# Patient Record
Sex: Female | Born: 1960 | Hispanic: No | Marital: Married | Smoking: Never smoker
Health system: Southern US, Community
[De-identification: ages and names within clinical notes are randomized; demographics above are authoritative.]

## PROBLEM LIST (undated history)

## (undated) DIAGNOSIS — R011 Cardiac murmur, unspecified: Secondary | ICD-10-CM

## (undated) DIAGNOSIS — E119 Type 2 diabetes mellitus without complications: Secondary | ICD-10-CM

## (undated) DIAGNOSIS — E785 Hyperlipidemia, unspecified: Secondary | ICD-10-CM

## (undated) DIAGNOSIS — L719 Rosacea, unspecified: Secondary | ICD-10-CM

## (undated) DIAGNOSIS — K8020 Calculus of gallbladder without cholecystitis without obstruction: Secondary | ICD-10-CM

## (undated) DIAGNOSIS — M797 Fibromyalgia: Secondary | ICD-10-CM

## (undated) HISTORY — DX: Type 2 diabetes mellitus without complications: E11.9

## (undated) HISTORY — PX: NISSEN FUNDOPLICATION: SHX2091

## (undated) HISTORY — PX: FOOT SURGERY: SHX648

## (undated) HISTORY — PX: APPENDECTOMY: SHX54

## (undated) HISTORY — DX: Hyperlipidemia, unspecified: E78.5

## (undated) HISTORY — PX: HERNIA REPAIR: SHX51

## (undated) HISTORY — PX: KNEE ARTHROSCOPY: SUR90

## (undated) HISTORY — DX: Cardiac murmur, unspecified: R01.1

## (undated) HISTORY — PX: OTHER SURGICAL HISTORY: SHX169

---

## 1997-10-19 ENCOUNTER — Emergency Department (HOSPITAL_COMMUNITY): Admission: EM | Admit: 1997-10-19 | Discharge: 1997-10-19 | Payer: Self-pay | Admitting: Emergency Medicine

## 2000-11-24 ENCOUNTER — Emergency Department (HOSPITAL_COMMUNITY): Admission: EM | Admit: 2000-11-24 | Discharge: 2000-11-24 | Payer: Self-pay | Admitting: Emergency Medicine

## 2001-04-20 ENCOUNTER — Encounter: Payer: Self-pay | Admitting: Internal Medicine

## 2001-04-20 ENCOUNTER — Emergency Department (HOSPITAL_COMMUNITY): Admission: EM | Admit: 2001-04-20 | Discharge: 2001-04-20 | Payer: Self-pay | Admitting: Emergency Medicine

## 2002-12-17 ENCOUNTER — Ambulatory Visit (HOSPITAL_COMMUNITY): Admission: RE | Admit: 2002-12-17 | Discharge: 2002-12-17 | Payer: Self-pay | Admitting: Gastroenterology

## 2004-10-23 ENCOUNTER — Other Ambulatory Visit: Admission: RE | Admit: 2004-10-23 | Discharge: 2004-10-23 | Payer: Self-pay | Admitting: Obstetrics and Gynecology

## 2004-10-29 ENCOUNTER — Ambulatory Visit (HOSPITAL_COMMUNITY): Admission: RE | Admit: 2004-10-29 | Discharge: 2004-10-29 | Payer: Self-pay | Admitting: Obstetrics and Gynecology

## 2004-11-15 ENCOUNTER — Ambulatory Visit (HOSPITAL_COMMUNITY): Admission: RE | Admit: 2004-11-15 | Discharge: 2004-11-15 | Payer: Self-pay | Admitting: Obstetrics and Gynecology

## 2004-12-07 ENCOUNTER — Encounter: Admission: RE | Admit: 2004-12-07 | Discharge: 2004-12-07 | Payer: Self-pay | Admitting: Obstetrics and Gynecology

## 2005-04-11 ENCOUNTER — Encounter (INDEPENDENT_AMBULATORY_CARE_PROVIDER_SITE_OTHER): Payer: Self-pay | Admitting: Specialist

## 2005-04-11 ENCOUNTER — Inpatient Hospital Stay (HOSPITAL_COMMUNITY): Admission: EM | Admit: 2005-04-11 | Discharge: 2005-04-12 | Payer: Self-pay | Admitting: Emergency Medicine

## 2005-04-11 ENCOUNTER — Encounter: Admission: RE | Admit: 2005-04-11 | Discharge: 2005-04-11 | Payer: Self-pay | Admitting: Internal Medicine

## 2006-11-24 ENCOUNTER — Other Ambulatory Visit: Admission: RE | Admit: 2006-11-24 | Discharge: 2006-11-24 | Payer: Self-pay | Admitting: Internal Medicine

## 2006-12-10 ENCOUNTER — Inpatient Hospital Stay (HOSPITAL_COMMUNITY): Admission: RE | Admit: 2006-12-10 | Discharge: 2006-12-12 | Payer: Self-pay | Admitting: Gastroenterology

## 2006-12-10 ENCOUNTER — Encounter (INDEPENDENT_AMBULATORY_CARE_PROVIDER_SITE_OTHER): Payer: Self-pay | Admitting: Gastroenterology

## 2007-03-26 ENCOUNTER — Encounter: Admission: RE | Admit: 2007-03-26 | Discharge: 2007-03-26 | Payer: Self-pay | Admitting: Surgery

## 2007-04-09 ENCOUNTER — Ambulatory Visit (HOSPITAL_COMMUNITY): Admission: AD | Admit: 2007-04-09 | Discharge: 2007-04-12 | Payer: Self-pay | Admitting: Surgery

## 2007-06-02 IMAGING — US US TRANSVAGINAL NON-OB
1 series · 14 of 25 positions shown · non-contrast
Comparison: none

CLINICAL DATA: Menorrhagia.  LMP 10/15/04.  Patient on birth control pills.  History of tubal ligation.  
 TRANSABDOMINAL AND TRANSVAGINAL PELVIC ULTRASOUND:
 Transabdominal and transvaginal scanning of the pelvis was performed. 
 The uterus is mildly enlarged measuring 11 cm in length, 6.5 cm in depth, and 8.3 cm in width.  Within the endometrium there is a hypoechoic mass measuring 3.5 x 2.1 x 2.4 cm that is likely a submucosal fibroid.  Endometrial thickness is approximately 16 to 17 mm adjacent to the mass.  No other intramural fibroids are identified.  This seems to be attached to the fundal portion of the myometrium.  
 The right ovary contains a simple cyst measuring 2.8 x 1.7 x 1.6 cm.  The left ovary is poorly seen, but normal.  No free fluid is identified.

[Series 1: us transvaginal non-ob · 0.41mm/px · 14 of 54 slices shown]
[im 1/54]
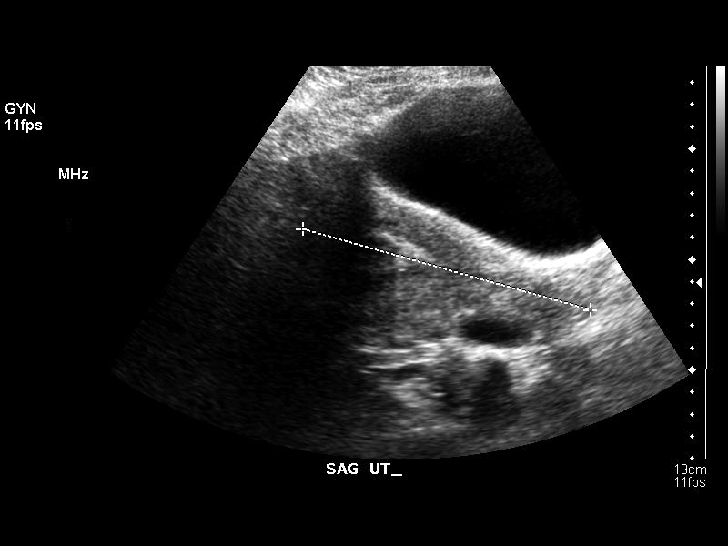
[im 5/54]
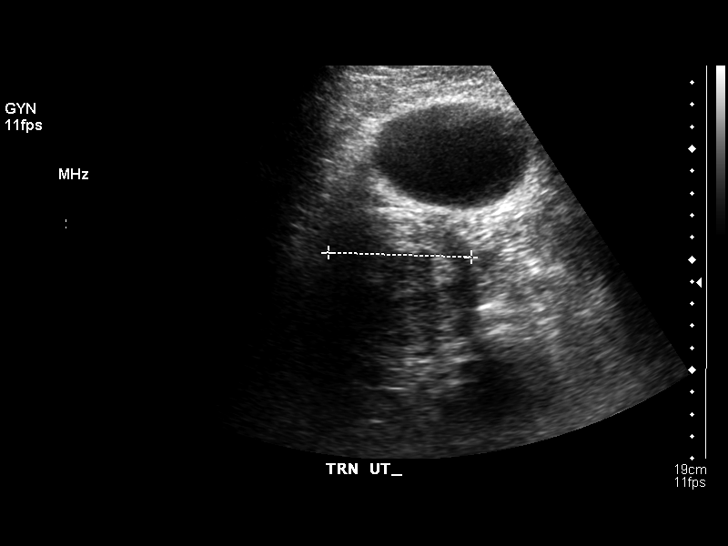
[im 9/54]
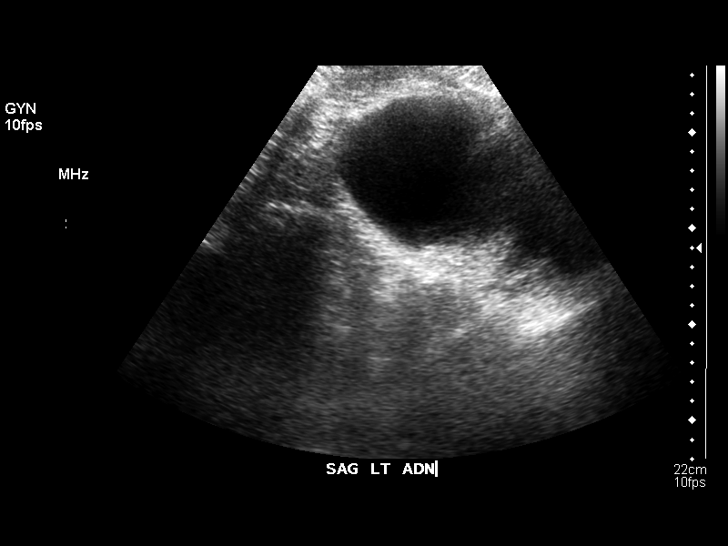
[im 14/54]
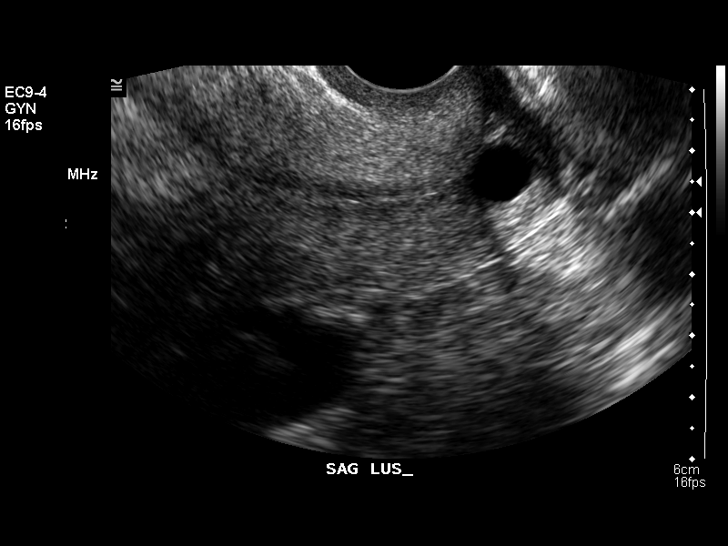
[im 18/54]
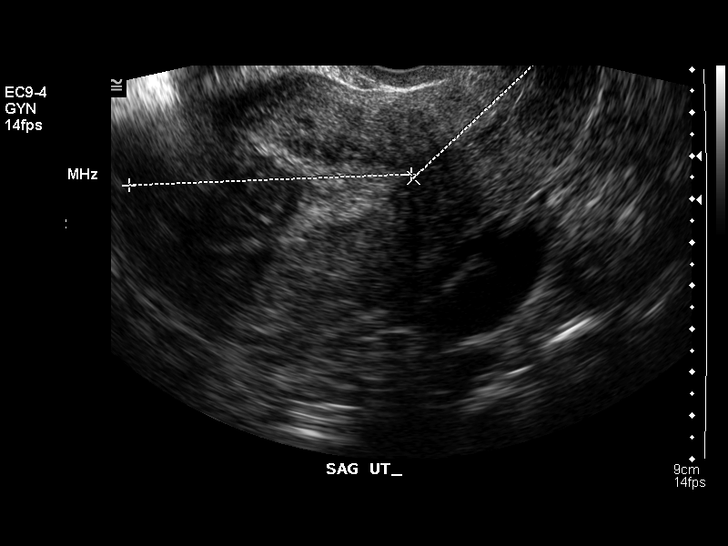
[im 20/54]
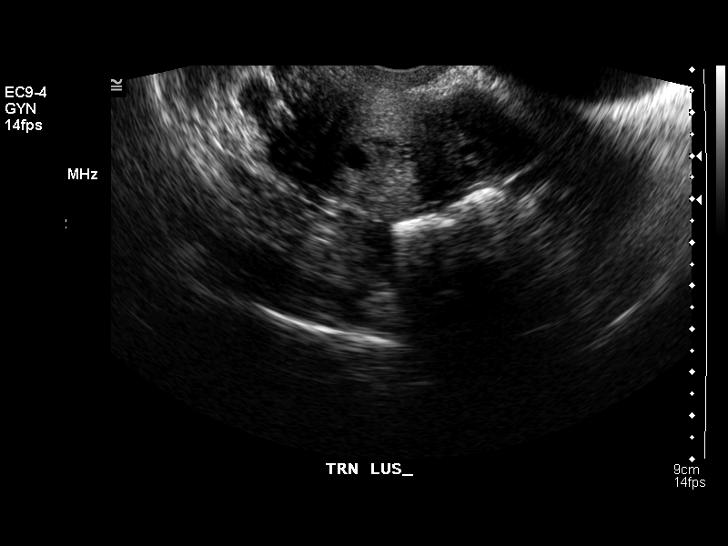
[im 25/54]
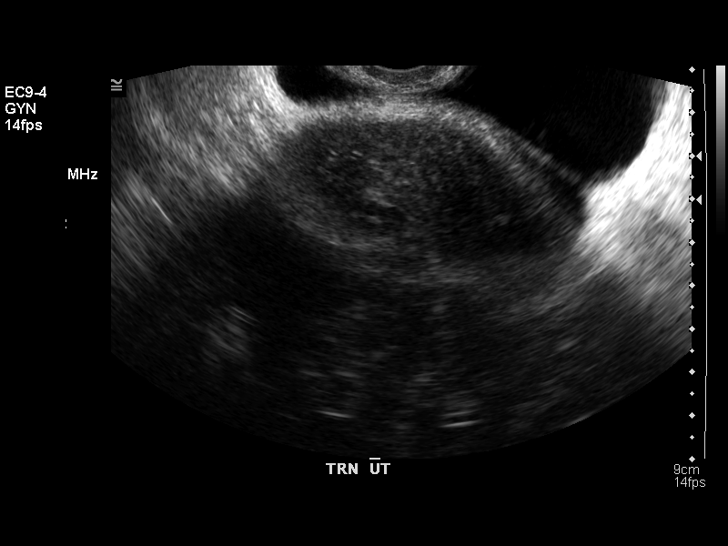
[im 29/54]
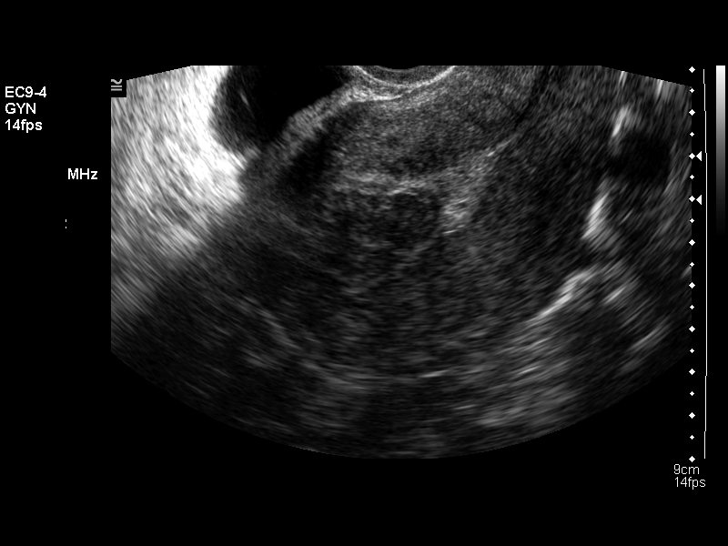
[im 34/54]
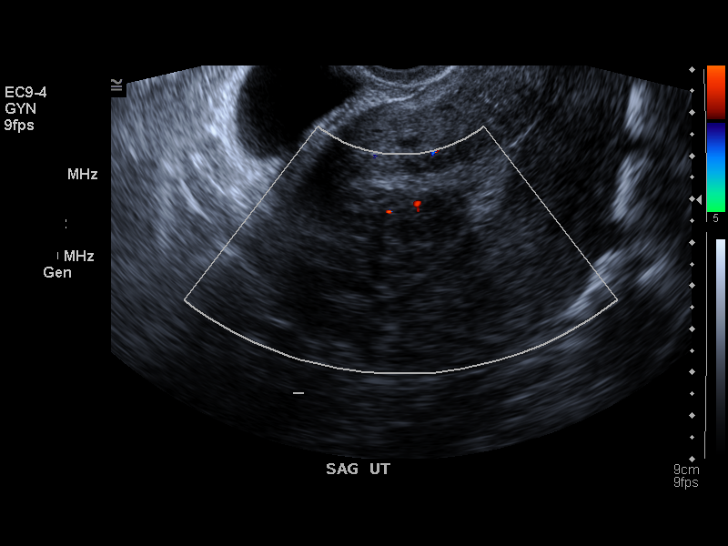
[im 36/54]
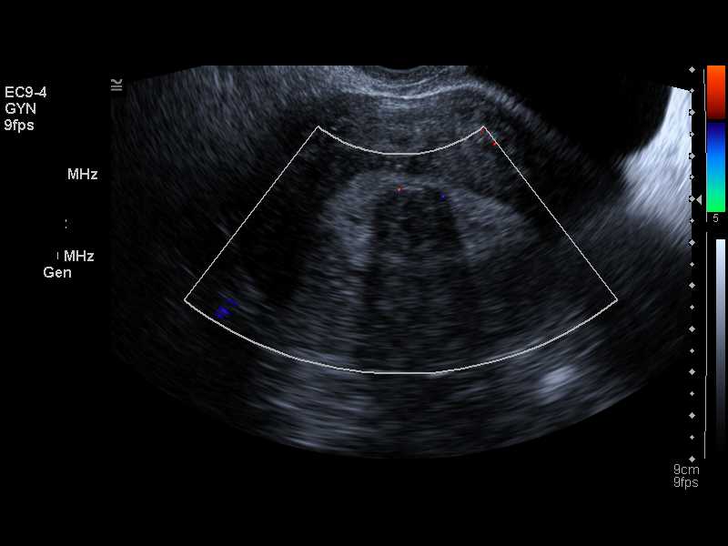
[im 40/54]
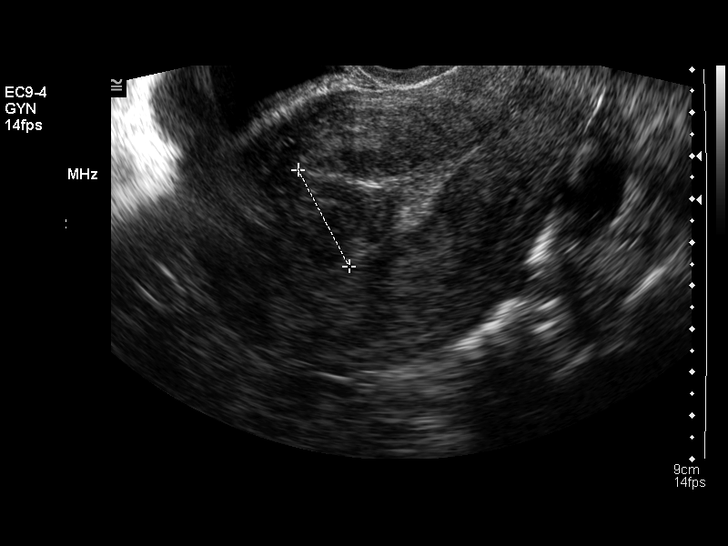
[im 45/54]
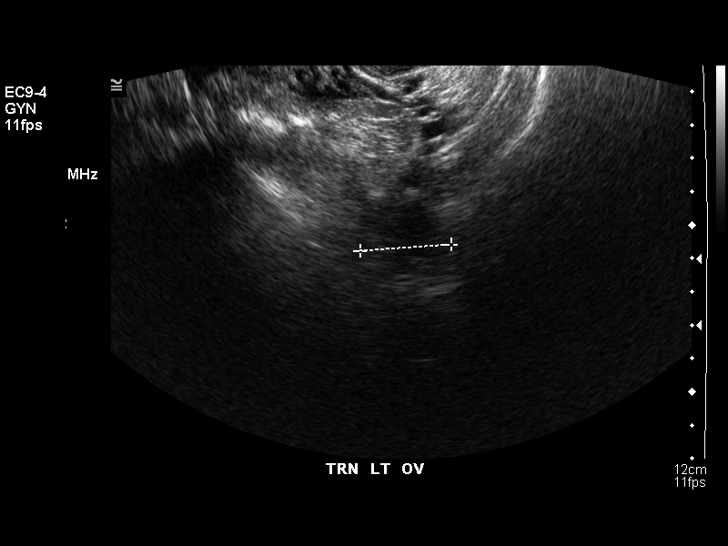
[im 49/54]
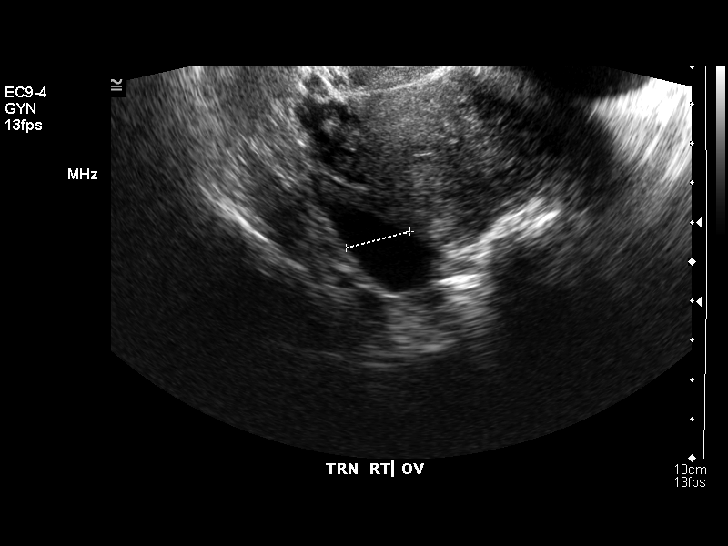
[im 54/54]
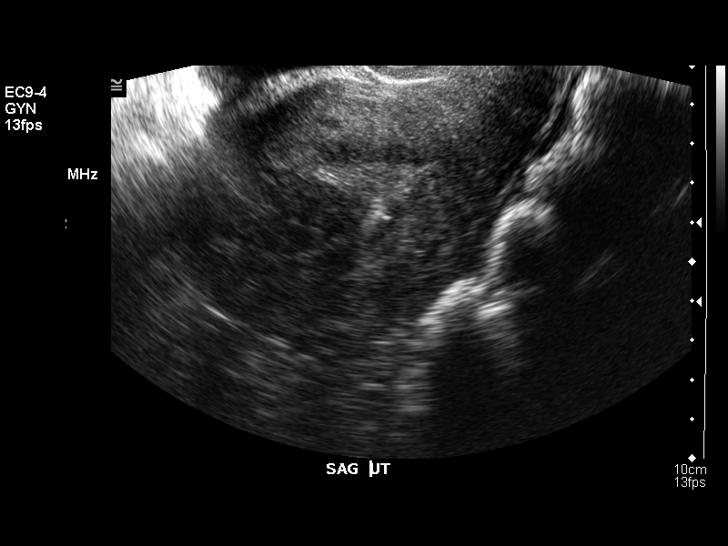

[14 of 25 positions shown; findings below may reference images not displayed]

IMPRESSION: 1.  Mass within the endometrium likely representing a submucosal fibroid measuring 3.5 x 2.1 x 2.4 cm.  The uterus is mildly enlarged.
 2.  Simple cyst in the right ovary likely representing a follicle.  Normal left ovary.

## 2007-11-04 ENCOUNTER — Encounter: Admission: RE | Admit: 2007-11-04 | Discharge: 2007-11-04 | Payer: Self-pay | Admitting: Sports Medicine

## 2010-04-01 ENCOUNTER — Encounter: Payer: Self-pay | Admitting: Obstetrics and Gynecology

## 2010-04-01 ENCOUNTER — Encounter: Payer: Self-pay | Admitting: Internal Medicine

## 2010-07-24 NOTE — Op Note (Signed)
[name], [name]            ACCOUNT NO.:  [id number]   MEDICAL RECORD NO.:  [id number]          PATIENT TYPE:  INP   LOCATION:  1317                         FACILITY:  [facility]   PHYSICIAN:  Danise Edge, M.D.   DATE OF BIRTH:  Aug 08, 1960   DATE OF PROCEDURE:  12/10/2006  DATE OF DISCHARGE:                               OPERATIVE REPORT   ESOPHAGOGASTRODUODENOSCOPY:   PROBLEM:  Epigastric pain, vomiting, hematemesis and heartburn.   HISTORY:  [name] is a 50 year old female born Jul 30, 1960.   [name] has chronic gastroesophageal reflux disease.  On November 11, 2006, she underwent an esophagogastroduodenoscopy which was normal.  Gastric biopsies were positive for H. pylori gastritis.  Esophageal  biopsies did not show eosinophilic esophagitis.   On November 17, 2006, she received Prevacid, amoxicillin and Biaxin for  2 weeks to eradicate H. pylori gastritis.   Yesterday [name] was seen in my office.  She has been having  intermittent epigastric pain associated with severe nausea, vomiting and  heartburn.  She has had blood mixed in her vomitus.  She has remained  off her proton pump inhibitor in order to undergo a urea breath test to  confirm eradication of the H. pylori gastritis.   MEDICATION ALLERGIES:  None.   PREMEDICATION:  Maalox and Tums.   PAST MEDICAL-SURGICAL HISTORY:  1. Chronic gastroesophageal reflux disease.  2. Rosacea.  3. Knee surgery.  4. Normal colonoscopy, 2004.  5. Iron-deficiency anemia associated with menorrhagia.  6. Appendectomy.  7. April 11, 2005, CT scan of the abdomen and pelvis was consistent      with appendicitis and showed a 1-cm left renal cyst.  8. November 11, 2006, esophagogastroduodenoscopy revealed H. pylori      gastritis   FAMILY HISTORY:  Positive for diabetes.   HEALTH MAINTENANCE:  Tetanus toxoid booster, 2001.   Weight 16901/2 pounds, temperature 98.4 degrees, blood pressure 132/70.  HEENT:  Nonicteric sclerae.  Normal oropharynx.  ABDOMEN:  Soft, flat and nontender.   ENDOSCOPIST:  Danise Edge, M.D.   PREMEDICATION:  Fentanyl 50 mcg, Versed 8 mg.   PROCEDURE:  After obtaining informed consent, [name] was placed in  the left lateral decubitus position.  I administered intravenous  fentanyl and intravenous Versed to achieve conscious sedation for the  procedure.  The patient's blood pressure, oxygen saturation and cardiac  rhythm were monitored throughout the procedure and documented in the  medical record.   The Pentax gastroscope was passed through the posterior hypopharynx into  the proximal esophagus without difficulty.  The hypopharynx, larynx and  vocal cords appeared normal.   Esophagoscopy:  The proximal and mid esophagus appears normal.  There is  severe erosive esophagitis involving the distal esophagus.  There is  circumferential erosive esophagitis at the esophagogastric junction with  linear erosions extending proximally.  There is no active bleeding.  There is no esophageal obstruction.  Her esophagus appeared normal by  esophagogastroduodenoscopy November 11, 2006.   Gastroscopy:  [name] has a hiatal hernia.  Retroflex view of the  gastric cardia and fundus revealed  a patulous diaphragmatic hiatus.  The  gastric body appeared normal.  There are scattered erosions in the  gastric antrum with a patent pylorus.  Biopsies were taken from the  gastric antrum and gastric body to look for H. pylori gastritis.   Duodenoscopy:  The duodenal bulb and descending duodenum appeared  normal.   ASSESSMENT:  1. Severe erosive esophagitis.  2. Chronic gastroesophageal reflux disease with a hiatal hernia and      patulous diaphragmatic hiatus.  Squamocolumnar junction is noted at      approximately 35 cm from the incisor teeth.  3. Gastric biopsies performed to rule out Helicobacter pylori      gastritis.   PLAN:  [name] will need to get  back on her proton pump inhibitor  drugs.  I have obtained the following laboratory data:  Liver profile,  lipase, and fasting serum gastrin.           ______________________________  Danise Edge, M.D.     MJ/MEDQ  D:  12/10/2006  T:  12/10/2006  Job:  [id number]   cc:   Deirdre Peer. Polite, M.D.

## 2010-07-24 NOTE — Op Note (Signed)
NAMEJAZZMYN, FILION            ACCOUNT NO.:  [id number]   MEDICAL RECORD NO.:  [id number]          PATIENT TYPE:  OIB   LOCATION:  1335                         FACILITY:  [facility]   PHYSICIAN:  Thornton Park. Daphine Deutscher, MD  DATE OF BIRTH:  [birth year]   DATE OF PROCEDURE:  DATE OF DISCHARGE:                               OPERATIVE REPORT   PREOPERATIVE DIAGNOSIS:  Gastroesophageal reflux disease with hiatal  hernia.   POSTOPERATIVE DIAGNOSIS:  Gastroesophageal reflux disease with hiatal  hernia.   PROCEDURE:  Laparoscopic repair of hiatal hernia with three posterior  pledgeted sutures of  Surgidac and onlay patch of MTF sutured to the  diaphragm, Nissen fundoplication over a #56-French bougie and upper  endoscopy per Dr. Ezzard Standing.   DESCRIPTION OF PROCEDURE:  Chenell Lozon is a 50 year old lady with  gastroesophageal reflux disease and has had workup by Dr. Reece Agar.   The patient was brought to room 1 on April 09, 2007 and given general  anesthesia.  The abdomen was prepped with TechniCare and draped  sterilely.  I entered the abdomen through the left upper quadrant using  a zero-degree Optiview and then had no difficulty insufflating the  abdomen and then introducing the standard trocars.  She has little  omental stuck into her umbilical region where she previously had some  female surgery.   A Nathanson retractor was placed beneath the left lateral segment to  expose the gastroesophageal junction.  I went ahead and took down the  gastrohepatic membrane and then took down the peritoneal investment at  the esophagogastric junction.  I then went over and took down short  gastrics and worked my way up to the EG junction taking these down.  Ultimately I had to go back over there and take some down more  posteriorly to enable me to get a nice free wrap.  In the meantime once  this was done, I then exposed the crura and had nice exposure to crura  which I approximated posteriorly  with three pledgeted sutures using the  Endo stitch.  These were tied extracorporeally. I then cut a piece of  the MTF graft and placed it anteriorly where it was solid with an  opening for the esophagus and then brought it down on either side  tacking it both at the apex and the 12 o'clock position and at the 4  o'clock and 8 o'clock position and then posteriorly with a horizontal  mattress suture through and through both limbs of the mesh.   Once in place, a 56 bougie was then passed by Dr. Rica Mast easily.  The  stomach was again, I had to free it posteriorly taking a fairly sizable  short gastric which I clipped before using the harmonic scalpel to  divide. I then performed a wrap with the top stitch using a free suture  and a tie knot and then two sutures placed with the Endo stitch.  Tisseel was applied over the wrap and over the mesh which was draped  over the esophagus.  After doing the dissection, Dr. Ezzard Standing pass the  endoscope which showed some evidence  of erosive esophagitis above the GE  junction. That was performed without difficulty.  Everything went well.  The port sites were checked with Marcaine and closed with 4-0 Vicryl and  Benzoin and Steri-Strips.  The patient seemed to tolerate the procedure  well and was taken to the recovery room in satisfactory condition.      Thornton Park Daphine Deutscher, MD  Electronically Signed     MBM/MEDQ  D:  04/09/2007  T:  04/09/2007  Job:  [id number]   cc:   Deirdre Peer. Polite, M.D.   Danise Edge, M.D.  Fax: [phone]

## 2010-07-24 NOTE — Discharge Summary (Signed)
Vanessa Cole, Vanessa Cole            ACCOUNT NO.:  [id number]   MEDICAL RECORD NO.:  [id number]          PATIENT TYPE:  INP   LOCATION:  1317                         [facility]ACILITY:  [facility]   PHYSICIAN:  Danise Edge, M.D.   DATE OF BIRTH:  [birth year]   DATE OF ADMISSION:  12/10/2006  DATE OF DISCHARGE:  12/12/2006                               DISCHARGE SUMMARY   DISCHARGE DIAGNOSIS:  Severe erosive gastroesophageal reflux-induced  esophagitis.   DISCHARGE MEDICATIONS:  Prevacid 30 mg before breakfast, 30 mg before  supper; Pepcid AC at bedtime.   OFFICE FOLLOW-UP:  I will ask Ms. Ciaravino to see me back in the office  in approximately 2-3 weeks.   HOSPITAL COURSE:  Vanessa Cole is a 50 year old female born [birth year].  Vanessa Cole has chronic gastroesophageal reflux disease.   On November 11, 2006, she underwent an esophagogastroduodenoscopy which  was normal.  Gastric biopsies were positive for H. pylori gastritis.  Esophageal biopsies did not show eosinophilic esophagitis.  On November 17, 2006, she received Prevacid, amoxicillin and Biaxin for 2 weeks to  eradicate H. pylori gastritis.   Following therapy to eradicate H. pylori gastritis, Vanessa Cole  Prevacid was stopped in anticipation of performing a urea breath test to  confirm H. Pylori eradication.  Within a short time off Prevacid, she  began having severe epigastric discomfort with severe nausea, vomiting  and heartburn.  She reported blood in her vomitus.   On admission to the hospital, Vanessa Cole lipase, complete metabolic  profile and serum lipase were normal.   Vanessa Cole underwent an esophagogastroduodenoscopy which revealed  severe erosive distal esophagitis with a normal-appearing stomach and  duodenum.  Gastric biopsies were normal and did not show H. pylori  gastritis.   Vanessa Cole was admitted to the hospital and placed on intravenous  Protonix twice daily.  Her nausea, vomiting,  hematemesis and heartburn  resolved slowly.   I performed an abdominal ultrasound to rule out gallstones, and her  abdominal ultrasound did not show gallstones.  It did show a possible  small gallbladder polyp.   At discharge, Vanessa Cole is in stable medical condition, tolerating a  regular diet.  I will heal her esophagitis with Prevacid twice daily and  Pepcid at bedtime.   Vanessa Cole would prefer not remaining on Prevacid for life time.  A  short trial off Prevacid resulted in severe erosive esophagitis with  nausea and vomiting.  She is interested in looking into antireflux  surgery.   MEDICATIONS ALLERGIES:  None.   PAST MEDICAL HISTORY:  1. Gastroesophageal reflux disease.  2. Rosacea.  Minocycline has been discontinued.  3. Knee surgery.  4. Normal colonoscopy 2004.  5. Iron deficiency anemia associated with menorrhagia.  6. Appendectomy.  7. April 11, 2005, CT scan of the abdomen and pelvis was consistent      with appendicitis and showed a 1-cm left renal cyst.  8. November 11, 2006, esophagogastroduodenoscopy was normal; gastric      biopsies were positive for H. pylori gastritis.  H. pylori      gastritis  was eradicated using Prevacid, amoxicillin and Biaxin.   HEALTH MAINTENANCE:  Tetanus toxoid booster 2001.   LABORATORY DATA:  Admission electrocardiogram revealed sinus rhythm with  marked sinus arrhythmia.  Admission lipase, complete metabolic profile  and CBC were normal.  A serum gastrin was ordered but was never run.           ______________________________  Danise Edge, M.D.     MJ/MEDQ  D:  12/12/2006  T:  12/12/2006  Job:  [id number]   cc:   Deirdre Peer. Polite, M.D.

## 2010-07-27 NOTE — Op Note (Signed)
   Vanessa Cole, Vanessa Cole                        ACCOUNT NO.:  [id number]   MEDICAL RECORD NO.:  [id number]                   PATIENT TYPE:  AMB   LOCATION:  ENDO                                 [facility]ACILITY:  [facility]   PHYSICIAN:  Danise Edge, M.D.                DATE OF BIRTH:  August 22, 1960   DATE OF PROCEDURE:  12/17/2002  DATE OF DISCHARGE:                                 OPERATIVE REPORT   PROCEDURE:  Colonoscopy.   PROCEDURE INDICATION:  Vanessa Cole is a 50 year old female, born  1960/11/20.  [name] has guaiac positive stool and microcytic anemia.  She continues to have menses.   ENDOSCOPIST:  Charolett Bumpers, M.D.   PREMEDICATION:  1. Versed 10 mg.  2. Demerol 50 mg.   DESCRIPTION OF PROCEDURE:  After obtaining informed consent, [name]  was placed in the left lateral decubitus position.  I administered  intravenous Demerol and intravenous Versed to achieve conscious sedation for  the procedure.  The patient's blood pressure, oxygen saturation, and cardiac  rhythm were monitored throughout the procedure and documented in the medical  record.   Anal inspection was normal.  Digital rectal exam was normal.  The Olympus  pediatric adjustable colonoscope was introduced into the rectum and advanced  to the cecum.  Colonic preparation for the exam today was satisfactory.   RECTUM:  Normal.  SIGMOID COLON AND DESCENDING COLON:  Normal.  SPLENIC FLEXURE:  Normal.  TRANSVERSE COLON:  Normal.  HEPATIC FLEXURE:  Normal.  ASCENDING COLON:  Normal.  CECUM AND ILEOCECAL VALVE:  Normal.   ASSESSMENT:  Normal proctocolonoscopy to the cecum.  No endoscopic evidence  for the presence of lower gastrointestinal bleeding, colorectal neoplasia,  or inflammatory bowel disease.    RECOMMENDATIONS:  1. Iron sulfate for 100 days to correct her apparent iron deficiency anemia.     She should then remain on a multivitamin with iron daily until she goes     through  menopause.  2. Repeat colonoscopy at age 72.                                               Danise Edge, M.D.    MJ/MEDQ  D:  12/17/2002  T:  12/17/2002  Job:  [id number]   cc:   Ike Bene, M.D.  [address]  [place]  [place] [address]  Fax: [phone]

## 2010-07-27 NOTE — Op Note (Signed)
Vanessa Cole, SHERRILL            ACCOUNT NO.:  [id number]   MEDICAL RECORD NO.:  [id number]          PATIENT TYPE:  INP   LOCATION:  5707                         FACILITY:  [facility]   PHYSICIAN:  Gabrielle Dare. Janee Morn, M.D.DATE OF BIRTH:  20-May-1960   DATE OF PROCEDURE:  04/11/2005  DATE OF DISCHARGE:                                 OPERATIVE REPORT   PREOPERATIVE DIAGNOSIS:  Acute appendicitis.   POSTOPERATIVE DIAGNOSIS:  Acute appendicitis.   PROCEDURES:  Laparoscopic appendectomy.   SURGEON:  Gabrielle Dare. Janee Morn, M.D.   ANESTHESIA:  General.   HISTORY OF PRESENT ILLNESS:  The patient is a 50 year old female who is a  patient of Dr. Kevan Ny.  She developed right lower quadrant pain and signs and  symptoms consistent with acute appendicitis.  A CT scan revealed acute  nonruptured appendicitis, and she is brought to the operating room for  emergency appendectomy.   PROCEDURE IN DETAIL:  Informed consent was obtained; the patient was  identified in the preop holding area.  She received intravenous antibiotics.  She was brought to the operating room and general anesthesia was  administered. Her abdomen was prepped and draped in a sterile fashion. An  infraumbilical incision was made in subcutaneous tissue, after injecting  deep tissues with 0.25% Marcaine with epinephrine. Subcutaneous tissues were  dissected down, revealing the anterior fascia which was divided sharply. The  peritoneal cavity was then entered under direct vision without difficulty.  The 0 Vicryl pursestring suture was placed around the fascial opening, and  the Hasson trocar was inserted into the abdomen. The abdomen was insufflated  with carbon dioxide in a standard fashion.  Under direct vision, a 12 mm  left lower quadrant and a 5 mm right upper quadrant port were placed.  Laparoscopic exploration revealed a few adhesions to the anterior abdominal  wall from the cecal area. These were filmy and were taken down easily  with  cautery and scissors.  A further exploration revealed an inflamed appendix  that extended from the cecum. The base was normal, became quite inflamed  slightly distal, and then wrapped around to the side and was partly  retroperitoneal.  The first base was freed up and then divided under direct  vision with endoscopic GIA stapler with a vascular load. The mesoappendix  was then freed up from the retroperitoneal attachments, and then gradually  divided with three firings of the endoscopic GIA stapler.  The appendix was  placed in an EndoCatch bag and sent to pathology.  The abdomen was copiously  irrigated with a total of 2 liters of warm saline.  Staple lines were clean  and dry. The remainder of the irrigation fluid was evacuated and it was  clear. The ports were removed under direct vision.  After checking the  staple lines again and noting good hemostasis, the pneumoperitoneum was  released. The Hasson trocar was removed. The infraumbilical fascia was  closed by tying the 0 Vicryl pursestring suture, with care not to trap the  intra-abdominal contents.  All three wounds were copiously irrigated. Some  additional local anesthetic was injected and the skin  of each was closed  with a running 4-0 Monocryl  subcuticular stitch. Sponge, needle and instrument counts were correct.  Benzoin, Steri-Strips and sterile dressings were applied. The patient  tolerated the procedure well without apparent complication; was taken to  recovery room in stable condition.      Gabrielle Dare Janee Morn, M.D.  Electronically Signed     BET/MEDQ  D:  04/11/2005  T:  04/12/2005  Job:  [id number]   cc:   Candyce Churn, M.D.  Fax: [phone]

## 2010-07-27 NOTE — H&P (Signed)
Vanessa Cole, [place] NO.:  [id number]   MEDICAL RECORD NO.:  [id number]          PATIENT TYPE:  INP   LOCATION:  1823                         FACILITY:  [facility]   PHYSICIAN:  Lebron Conners, M.D.   DATE OF BIRTH:  November 17, 1960   DATE OF ADMISSION:  04/11/2005  DATE OF DISCHARGE:                                HISTORY & PHYSICAL   ADMITTING PHYSICIAN:  Dr. Orson Slick   PRIMARY CARE PHYSICIAN:  Dr. Johnella Moloney   CHIEF COMPLAINT:  Abdominal pain with CT abdomen and pelvis consistent with  acute appendicitis.   HISTORY OF PRESENT ILLNESS:  Vanessa Cole is a 50 year old female patient,  only past medical history significant for anemia and surgical history tubal  ligation. She developed abdominal pain 2 days ago, initially in the  epigastric region. By yesterday the pain had localized to the right lower  quadrant. She is able to walk with minimal pain. She has been able to void  and have bowel movements without any difficulty. She has had one episode of  nausea and vomiting yesterday, kind of persistent nausea throughout the day.  She takes iron pills regularly and had crackers and milk early this morning  to get her iron pill down; otherwise, has not eaten much more than a bowl of  chicken soup since yesterday. She did have a bowel movement today after  undergoing CT of the abdomen and pelvis. This bowel movement was loose. She  states that the pain is not as severe today as it was yesterday. She did  undergo a CT of the abdomen and pelvis today that showed an acute  nonruptured appendix consistent with appendicitis. She has a small hiatal  hernia, small left renal cyst, and a submucosal uterine fibroid. Based on  these findings, the patient will be admitted and scheduled for appendectomy  later today.   REVIEW OF SYSTEMS:  As per the history of present illness. No fevers, no  chills.   ALLERGIES:  No known drug allergies.   CURRENT MEDICATIONS:  Iron sulfate.   PAST MEDICAL HISTORY:  Iron deficiency anemia.   PAST SURGICAL HISTORY:  Tubal ligation.   PHYSICAL EXAMINATION:  GENERAL:  Pleasant female in mild abdominal pain,  currently non-toxic-appearing.  VITAL SIGNS:  Temperature 97.1, blood pressure 122/79, pulse 105,  respirations 20.  HEENT:  Head is normocephalic, sclerae not injected.  NECK:  Supple, no adenopathy.  CHEST:  Lungs clear to auscultation. She is saturating 99% on room air.  Nonlabored effort.  CARDIAC:  S1, S2. No rubs, murmurs, thrills. No gallops, no JVD. Pulses  regular.  ABDOMEN:  Soft, nondistended. Bowel sounds are present but hypoactive. She  is focally tender in the right lower quadrant without guarding or  rebounding.  EXTREMITIES:  Symmetrical without edema, cyanosis, or clubbing.   LABORATORY DATA:  CBC, CMET, PT, PTT, and urine pregnancy are pending. A CT  of the abdomen was done outpatient and, again, shows a nonruptured acute  appendicitis.   IMPRESSION:  1.  Acute appendicitis.  2.  Known iron deficiency anemia.  3.  Incidental finding of stable submucosal  uterine fibroid.   PLAN:  1.  The patient will undergo  appendectomy today by Dr. Orson Slick or one of his      associates pending availability of OR suite.  2.  Unasyn IV started. Will also use Dilaudid for pain and Phenergan for      nausea.  3.  Will hydrate her with D-5-half normal saline with 20 of potassium at 150      an hour.      Vanessa Cole, N.P.      Lebron Conners, M.D.  Electronically Signed    ALE/MEDQ  D:  04/11/2005  T:  04/11/2005  Job:  [id number]   cc:   Candyce Churn, M.D.  Fax: [phone]

## 2010-11-29 LAB — CBC
HCT: 39.1
Hemoglobin: 13.3
Hemoglobin: 13.5
MCHC: 34.4
MCHC: 34.5
MCV: 83.2
Platelets: 257
Platelets: 264
RBC: 4.7

## 2010-11-29 LAB — HEMOGLOBIN AND HEMATOCRIT, BLOOD: HCT: 42.9

## 2010-12-20 LAB — CBC
Hemoglobin: 13.1
MCHC: 34.2
MCV: 83.4
Platelets: 268
RDW: 13.6
WBC: 5.2

## 2010-12-20 LAB — DIFFERENTIAL
Eosinophils Relative: 2
Monocytes Relative: 8
Neutro Abs: 2.7

## 2010-12-20 LAB — COMPREHENSIVE METABOLIC PANEL
AST: 20
Albumin: 3.5
BUN: 7
Calcium: 9
Creatinine, Ser: 0.57
GFR calc Af Amer: >60
Sodium: 142
Total Protein: 5.8 — ABNORMAL LOW

## 2010-12-20 LAB — LIPASE, BLOOD: Lipase: 27

## 2011-10-04 ENCOUNTER — Encounter (HOSPITAL_COMMUNITY): Payer: Self-pay | Admitting: *Deleted

## 2011-10-04 DIAGNOSIS — R209 Unspecified disturbances of skin sensation: Secondary | ICD-10-CM | POA: Insufficient documentation

## 2011-10-04 DIAGNOSIS — R42 Dizziness and giddiness: Secondary | ICD-10-CM | POA: Insufficient documentation

## 2011-10-04 DIAGNOSIS — Z886 Allergy status to analgesic agent status: Secondary | ICD-10-CM | POA: Insufficient documentation

## 2011-10-04 DIAGNOSIS — R0789 Other chest pain: Secondary | ICD-10-CM | POA: Insufficient documentation

## 2011-10-04 NOTE — ED Notes (Signed)
Rt sided chest numbness for one week and the pain has started radiating up into her rt neck and head.

## 2011-10-05 ENCOUNTER — Emergency Department (HOSPITAL_COMMUNITY): Payer: Self-pay

## 2011-10-05 ENCOUNTER — Emergency Department (HOSPITAL_COMMUNITY)
Admission: EM | Admit: 2011-10-05 | Discharge: 2011-10-05 | Disposition: A | Discharge status: Home / Self Care | Payer: Self-pay | Attending: Emergency Medicine | Admitting: Emergency Medicine

## 2011-10-05 DIAGNOSIS — R202 Paresthesia of skin: Secondary | ICD-10-CM

## 2011-10-05 LAB — COMPREHENSIVE METABOLIC PANEL
Albumin: 4.6 g/dL (ref 3.5–5.2)
Calcium: 10 mg/dL (ref 8.4–10.5)
GFR calc non Af Amer: >90 mL/min (ref >90)
Glucose, Bld: 225 mg/dL — ABNORMAL HIGH (ref 70–99)
Potassium: 4.2 mEq/L (ref 3.5–5.1)
Total Bilirubin: 0.4 mg/dL (ref 0.3–1.2)
Total Protein: 7.7 g/dL (ref 6.0–8.3)

## 2011-10-05 LAB — CBC WITH DIFFERENTIAL/PLATELET
Basophils Relative: 0 % (ref 0–1)
Eosinophils Relative: 2 % (ref 0–5)
Hemoglobin: 15.4 g/dL — ABNORMAL HIGH (ref 12.0–15.0)
MCH: 28.9 pg (ref 26.0–34.0)
Monocytes Absolute: 0.6 10*3/uL (ref 0.1–1.0)
Monocytes Relative: 9 % (ref 3–12)
Neutrophils Relative %: 47 % (ref 43–77)
WBC: 7 10*3/uL (ref 4.0–10.5)

## 2011-10-05 NOTE — ED Notes (Signed)
Pt c/o numbness and "shock-like" sensations to R shoulder/chest since July 9th.  She states she came today because the sensation began to shoot up her jaw.  Denies sob, nausea, but c/o occipital headache.

## 2011-10-05 NOTE — ED Provider Notes (Signed)
History     CSN: [id number]  Arrival date & time 10/04/11  2228   First MD Initiated Contact with Patient 10/05/11 0200     2:04 AM HPI  Reports right anterior chest, right upper arm and right neck numbness. Reports it has been intermittent for 1 week. Reports episodes are becoming more frequent. Denies aphasia, ataxia, weakness. Does report some lightheadedness. Describes the numbness as a sensation of electrical shocks in her chest. Denies headache. The history is provided by the patient.    History reviewed. No pertinent past medical history.  History reviewed. No pertinent past surgical history.  No family history on file.  History  Substance Use Topics  . Smoking status: Never Smoker   . Smokeless tobacco: Not on file  . Alcohol Use: No    OB History    Grav Para Term Preterm Abortions TAB SAB Ect Mult Living                  Review of Systems  Constitutional: Negative for fever, chills and fatigue.  Respiratory: Negative for shortness of breath and wheezing.   Cardiovascular: Negative for chest pain, palpitations and leg swelling.  Gastrointestinal: Negative for nausea, vomiting and abdominal pain.  Neurological: Positive for light-headedness and numbness. Negative for dizziness, weakness and headaches.  All other systems reviewed and are negative.    Allergies  Morphine and related  Home Medications   Current Outpatient Rx  Name Route Sig Dispense Refill  . AMOXICILLIN-POT CLAVULANATE 875-125 MG PO TABS Oral Take 1 tablet by mouth 2 (two) times daily. For sinus infection    . DOXYCYCLINE HYCLATE 100 MG PO TABS Oral Take 100 mg by mouth 2 (two) times daily. For rosacia      BP 132/93  Pulse 84  Temp 97.9 F (36.6 C) (Oral)  Resp 14  SpO2 96%  Physical Exam  Vitals reviewed. Constitutional: She is oriented to person, place, and time. Vital signs are normal. She appears well-developed and well-nourished.  HENT:  Head: Normocephalic and atraumatic.    Right Ear: External ear normal.  Left Ear: External ear normal.  Nose: Nose normal.  Mouth/Throat: Oropharynx is clear and moist. No oropharyngeal exudate.  Eyes: Conjunctivae and EOM are normal. Pupils are equal, round, and reactive to light. Right eye exhibits no discharge. Left eye exhibits no discharge.  Neck: Normal range of motion. Neck supple. No spinous process tenderness and no muscular tenderness present. No rigidity. Normal range of motion present.  Cardiovascular: Normal rate, regular rhythm and normal heart sounds.  Exam reveals no friction rub.   No murmur heard. Pulmonary/Chest: Effort normal and breath sounds normal. She has no wheezes. She has no rhonchi. She has no rales. She exhibits no tenderness.  Musculoskeletal: Normal range of motion.  Lymphadenopathy:    She has no cervical adenopathy.  Neurological: She is alert and oriented to person, place, and time. No cranial nerve deficit (tested CN III-XII). Coordination (Negative pronator drift, negative past-pointing, no nystagmus, ambulate without) normal.  Skin: Skin is warm and dry. No rash noted. No erythema. No pallor.  Psychiatric: She has a normal mood and affect. Her behavior is normal.    ED Course  Procedures  Results for orders placed during the hospital encounter of 10/05/11  CBC WITH DIFFERENTIAL      Component Value Range   WBC 7.0  4.0 - 10.5 K/uL   RBC 5.33 (*) 3.87 - 5.11 MIL/uL   Hemoglobin 15.4 (*) 12.0 -  15.0 g/dL   HCT 16.1  09.6 - 04.5 %   MCV 83.9  78.0 - 100.0 fL   MCH 28.9  26.0 - 34.0 pg   MCHC 34.5  30.0 - 36.0 g/dL   RDW 40.9  81.1 - 91.4 %   Platelets 273  150 - 400 K/uL   Neutrophils Relative 47  43 - 77 %   Neutro Abs 3.3  1.7 - 7.7 K/uL   Lymphocytes Relative 42  12 - 46 %   Lymphs Abs 2.9  0.7 - 4.0 K/uL   Monocytes Relative 9  3 - 12 %   Monocytes Absolute 0.6  0.1 - 1.0 K/uL   Eosinophils Relative 2  0 - 5 %   Eosinophils Absolute 0.2  0.0 - 0.7 K/uL   Basophils Relative 0   0 - 1 %   Basophils Absolute 0.0  0.0 - 0.1 K/uL  COMPREHENSIVE METABOLIC PANEL      Component Value Range   Sodium 142  135 - 145 mEq/L   Potassium 4.2  3.5 - 5.1 mEq/L   Chloride 103  96 - 112 mEq/L   CO2 29  19 - 32 mEq/L   Glucose, Bld 225 (*) 70 - 99 mg/dL   BUN 11  6 - 23 mg/dL   Creatinine, Ser 7.82 (*) 0.50 - 1.10 mg/dL   Calcium 95.6  8.4 - 21.3 mg/dL   Total Protein 7.7  6.0 - 8.3 g/dL   Albumin 4.6  3.5 - 5.2 g/dL   AST 18  0 - 37 U/L   ALT 15  0 - 35 U/L   Alkaline Phosphatase 102  39 - 117 U/L   Total Bilirubin 0.4  0.3 - 1.2 mg/dL   GFR calc non Af Amer >90  >90 mL/min   GFR calc Af Amer >90  >90 mL/min  TROPONIN I      Component Value Range   Troponin I <0.30  <0.30 ng/mL   Ct Head Wo Contrast  10/05/2011  *RADIOLOGY REPORT*  Clinical Data: Right-sided chest numbness, radiating to the head.  CT HEAD WITHOUT CONTRAST  Technique:  Contiguous axial images were obtained from the base of the skull through the vertex without contrast.  Comparison: None.  Findings: There is no evidence for acute hemorrhage, hydrocephalus, mass lesion, or abnormal extra-axial fluid collection.  No definite CT evidence for acute infarction.   The visualized paranasal sinuses and mastoid air cells are predominately clear.  IMPRESSION: No acute intracranial abnormality.  Original Report Authenticated By: Waneta Martins, M.D.     MDM    Patient has negative labs and imaging. Symptoms have been ongoing for one week. The suspicion for CVA or ACS due to this. CT scan was negative for CVA. Advise she may return for worsening symptoms but otherwise recommended followup with her primary care physician when she returns home in 2 weeks. Patient voices understanding and is ready for discharge.      Thomasene Lot, PA-C 10/05/11 [phone]

## 2011-10-05 NOTE — ED Notes (Signed)
Pt reports no change in shoulder discomfort.

## 2011-10-05 NOTE — ED Provider Notes (Signed)
Medical screening examination/treatment/procedure(s) were performed by non-physician practitioner and as supervising physician I was immediately available for consultation/collaboration.   Glynn Octave, MD 10/05/11 0430

## 2012-08-04 ENCOUNTER — Emergency Department (HOSPITAL_COMMUNITY)
Admission: EM | Admit: 2012-08-04 | Discharge: 2012-08-04 | Disposition: A | Discharge status: Home / Self Care | Payer: BC Managed Care – PPO | Source: Home / Self Care | Attending: Emergency Medicine | Admitting: Emergency Medicine

## 2012-08-04 ENCOUNTER — Encounter (HOSPITAL_COMMUNITY): Payer: Self-pay | Admitting: Emergency Medicine

## 2012-08-04 DIAGNOSIS — E860 Dehydration: Secondary | ICD-10-CM

## 2012-08-04 DIAGNOSIS — E119 Type 2 diabetes mellitus without complications: Secondary | ICD-10-CM

## 2012-08-04 LAB — POCT I-STAT, CHEM 8
Chloride: 101 mEq/L (ref 96–112)
Glucose, Bld: 429 mg/dL — ABNORMAL HIGH (ref 70–99)
HCT: 46 % (ref 36.0–46.0)
Hemoglobin: 15.6 g/dL — ABNORMAL HIGH (ref 12.0–15.0)
Potassium: 4.2 mEq/L (ref 3.5–5.1)
Sodium: 136 mEq/L (ref 135–145)

## 2012-08-04 LAB — POCT URINALYSIS DIP (DEVICE)
Bilirubin Urine: NEGATIVE
Glucose, UA: 100 mg/dL — AB
Hgb urine dipstick: NEGATIVE
Nitrite: NEGATIVE
Urobilinogen, UA: 0.2 mg/dL (ref 0.0–1.0)

## 2012-08-04 LAB — HEMOGLOBIN A1C: Mean Plasma Glucose: 283 mg/dL — ABNORMAL HIGH (ref <117)

## 2012-08-04 MED ORDER — METFORMIN HCL 500 MG PO TABS: 500.0000 mg | ORAL_TABLET | Freq: Two times a day (BID) | ORAL | Status: DC

## 2012-08-04 NOTE — ED Notes (Signed)
Pt c/o intermittent headache onset October... Reports she was dx w/DM in October in [place] but never f/u when she moved to [facility] Sx include: nauseas, dry mouth, freq urination... Has appt w/new PCP next week She is alert and oriented w/no signs of acute distress.

## 2012-08-04 NOTE — ED Provider Notes (Signed)
History     CSN: [id number]  Arrival date & time 08/04/12  1202   First MD Initiated Contact with Patient 08/04/12 1348      Chief Complaint  Patient presents with  . Headache    (Consider location/radiation/quality/duration/timing/severity/associated sxs/prior treatment) HPI Comments: Patient presents urgent care describing that she suspects that she has diabetes that she has been told in the past by a local doctor and in [place], by a primary care Dr. that she was seen. She recently moved back to [place] for a and describes this in September she has been feeling certain symptoms like frequent headaches feeling tired fatigued urinating more frequently especially at night several times a day and feeling thirsty. In the past she used not to drink a lot of water recently she's been drinking a lot of fluids. And urinating frequently. She has been told in the past that she had glucose intolerance but she never established continuity of care she has been moving frequently in the last few years.  Patient is a 52 y.o. female presenting with headaches. The history is provided by the patient.  Headache Pain location:  Generalized Radiates to:  Does not radiate Severity currently:  5/10 Context: not activity, not eating, not stress, not exposure to cold air and not intercourse   Relieved by:  Nothing Worsened by:  Nothing tried Ineffective treatments:  None tried Associated symptoms: no abdominal pain, no back pain, no cough, no drainage, no fever, no focal weakness, no myalgias, no nausea, no numbness, no paresthesias, no seizures, no sinus pressure and no vomiting     History reviewed. No pertinent past medical history.  History reviewed. No pertinent past surgical history.  History reviewed. No pertinent family history.  History  Substance Use Topics  . Smoking status: Never Smoker   . Smokeless tobacco: Not on file  . Alcohol Use: No    OB History   Grav Para Term Preterm Abortions  TAB SAB Ect Mult Living                  Review of Systems  Constitutional: Positive for appetite change. Negative for fever and activity change.  HENT: Negative for postnasal drip and sinus pressure.   Respiratory: Negative for cough.   Gastrointestinal: Negative for nausea, vomiting and abdominal pain.  Endocrine: Positive for cold intolerance, polydipsia, polyphagia and polyuria.  Genitourinary: Positive for frequency. Negative for hematuria, flank pain, genital sores and pelvic pain.  Musculoskeletal: Negative for myalgias and back pain.  Neurological: Positive for headaches. Negative for focal weakness, seizures, numbness and paresthesias.    Allergies  Morphine and related  Home Medications   Current Outpatient Rx  Name  Route  Sig  Dispense  Refill  . doxycycline (VIBRA-TABS) 100 MG tablet   Oral   Take 100 mg by mouth 2 (two) times daily. For rosacia         . amoxicillin-clavulanate (AUGMENTIN) 875-125 MG per tablet   Oral   Take 1 tablet by mouth 2 (two) times daily. For sinus infection         . metFORMIN (GLUCOPHAGE) 500 MG tablet   Oral   Take 1 tablet (500 mg total) by mouth 2 (two) times daily with a meal.   60 tablet   0     BP 133/89  Pulse 87  Temp(Src) 98.2 F (36.8 C) (Oral)  Resp 18  SpO2 100%  Physical Exam  Nursing note and vitals reviewed. Constitutional: Vital signs are  normal. She appears well-developed and well-nourished.  Non-toxic appearance. She does not have a sickly appearance. She does not appear ill. No distress.  HENT:  Head: Normocephalic.  Eyes: Conjunctivae are normal.  Neck: Neck supple. No JVD present.  Cardiovascular: Normal rate.  Exam reveals no gallop and no friction rub.   No murmur heard. Pulmonary/Chest: Effort normal and breath sounds normal.  Abdominal: Soft.  Lymphadenopathy:    She has no cervical adenopathy.  Neurological: She is alert. She has normal strength. No sensory deficit.  Skin: No rash noted.  No erythema.    ED Course  Procedures (including critical care time)  Labs Reviewed  POCT URINALYSIS DIP (DEVICE) - Abnormal; Notable for the following:    Glucose, UA 100 (*)    All other components within normal limits  POCT I-STAT, CHEM 8 - Abnormal; Notable for the following:    Glucose, Bld 429 (*)    Hemoglobin 15.6 (*)    All other components within normal limits  HEMOGLOBIN A1C   No results found.   1. Diabetes mellitus   2. Dehydration       MDM  Diabetes type 2. Patient had been symptomatic for several months and had been told in the past that she had diabetes or glucose intolerance. She has never been started on medicines. She recently obtained insurance and has appointment to establish with primary care Dr. locally and will see him in June 6        Jimmie Molly, MD 08/04/12 1444

## 2012-08-19 ENCOUNTER — Ambulatory Visit (INDEPENDENT_AMBULATORY_CARE_PROVIDER_SITE_OTHER): Payer: BC Managed Care – PPO | Admitting: Podiatry

## 2012-08-19 ENCOUNTER — Encounter: Payer: Self-pay | Admitting: Podiatry

## 2012-08-19 VITALS — BP 118/82 | HR 90

## 2012-08-19 DIAGNOSIS — M7750 Other enthesopathy of unspecified foot: Secondary | ICD-10-CM

## 2012-08-19 DIAGNOSIS — M7741 Metatarsalgia, right foot: Secondary | ICD-10-CM

## 2012-08-19 DIAGNOSIS — M21969 Unspecified acquired deformity of unspecified lower leg: Secondary | ICD-10-CM

## 2012-08-19 MED ORDER — DEXAMETHASONE SODIUM PHOSPHATE 10 MG/ML IJ SOLN: 4.0000 mg | Freq: Once | INTRAMUSCULAR | Status: AC

## 2012-08-19 MED ORDER — TRIAMCINOLONE ACETONIDE 40 MG/ML IJ SUSP: 4.0000 mg | Freq: Once | INTRAMUSCULAR | Status: AC

## 2012-08-19 NOTE — Progress Notes (Signed)
Subjective: 52 year old female presents complaining of 2nd toe right foot is turning to side( to lateral), and the base of the toe hurts to touch or to walk. Skin is very dry on both feet.  Diagnosed for DM last year. Now start taking medication.   Objective:  Tenderness at the 2nd MPJ right Metatarsus primus elevatus right>left. S/P right Austin, left Mcbride surgery with HT 2 bil.  All pedal pulses are palpable. All epicritic and tactile sensations grossly intact. No visible skin lesions noted.   X-ray: Short first Metatarsal bilateral with screw on right fist Metatarsal. Left Metatarsal had medial eminence resected.  No acute changes on osseous or articular surfaces seen.  Assessment: 1. 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 2. Hypermobile first ray bilateral R>L. 3. Short first ray bilateral. 4. Xerotic skin.  Plan/Treatment: 1. Bilateral X-rays done. 2. Injection given to 2nd MPJ right.  Injection consisted of a mixture of 4 mg Dexamethasone, 4 mg Triamcinolone, and 1 cc of 0.5% Marcaine plain. Patient tolerated well without difficulty.  3.  Reviewed the need for Orthotics.  Will prepare for Orthotics pending approval from [employer]. 4.  Patient is to use medicated shampoo and scrub dry skin at the end of each shower. Then apply moisturizing cream.

## 2012-09-02 ENCOUNTER — Encounter (INDEPENDENT_AMBULATORY_CARE_PROVIDER_SITE_OTHER): Payer: Self-pay | Admitting: Surgery

## 2012-09-08 ENCOUNTER — Ambulatory Visit (INDEPENDENT_AMBULATORY_CARE_PROVIDER_SITE_OTHER): Payer: BC Managed Care – PPO | Admitting: Surgery

## 2012-09-08 ENCOUNTER — Encounter (INDEPENDENT_AMBULATORY_CARE_PROVIDER_SITE_OTHER): Payer: Self-pay | Admitting: Surgery

## 2012-09-08 VITALS — BP 102/70 | HR 68 | Temp 98.7°F | Resp 16 | Ht 65.0 in | Wt 150.8 lb

## 2012-09-08 DIAGNOSIS — K8010 Calculus of gallbladder with chronic cholecystitis without obstruction: Secondary | ICD-10-CM

## 2012-09-08 NOTE — Progress Notes (Signed)
General Surgery [facility]  Chief Complaint  Patient presents with  . New Evaluation    cholelithiasis, abd pain, elevated LFT's - referral from Dr. Alysia Penna, [facility]    HISTORY: Patient is a 52 year old female referred by her primary care physician for evaluation for cholecystectomy. Patient is an insulin-dependent diabetic. She was noted on routine laboratory studies to have elevated transaminase levels. She described intermittent right upper quadrant abdominal pain occurring on a daily basis. She has occasional episodes of nausea. Ultrasound of the abdomen was performed and demonstrated cholelithiasis without signs of acute cholecystitis. Patient is now referred to general surgery for consideration for laparoscopic cholecystectomy.  Patient denies any prior history of jaundice or acholic stools. She denies any history of hepatitis or pancreatitis. Patient has previously undergone laparoscopic Nissen fundoplication and laparoscopic appendectomy.  Past Medical History  Diagnosis Date  . Diabetes mellitus without complication   . GERD (gastroesophageal reflux disease)   . Hyperlipidemia      Current Outpatient Prescriptions  Medication Sig Dispense Refill  . amoxicillin-clavulanate (AUGMENTIN) 875-125 MG per tablet Take 1 tablet by mouth 2 (two) times daily. For sinus infection      . doxycycline (VIBRA-TABS) 100 MG tablet Take 100 mg by mouth 2 (two) times daily. For rosacia      . insulin glargine (LANTUS) 100 UNIT/ML injection Inject 14 Units into the skin at bedtime.      . metFORMIN (GLUCOPHAGE) 500 MG tablet Take 1,000 mg by mouth 2 (two) times daily with a meal.       Current Facility-Administered Medications  Medication Dose Route Frequency Provider Last Rate Last Dose  . dexamethasone (DECADRON) injection 4 mg  4 mg Intravenous Once Myeong Sheard, DPM      . triamcinolone acetonide (KENALOG-40) injection 4 mg  4 mg Intramuscular Once Myeong  Sheard, DPM         Allergies  Allergen Reactions  . Morphine And Related Hives and Itching     History reviewed. No pertinent family history.   History   Social History  . Marital Status: Married    Spouse Name: N/A    Number of Children: N/A  . Years of Education: N/A   Social History Main Topics  . Smoking status: Never Smoker   . Smokeless tobacco: Never Used  . Alcohol Use: No  . Drug Use: No  . Sexually Active: None   Other Topics Concern  . None   Social History Narrative  . None     REVIEW OF SYSTEMS - PERTINENT POSITIVES ONLY: Denies jaundice. Denies acholic stools. Intermittent right upper quadrant abdominal pain. Intermittent nausea. Denies emesis.  EXAM: Filed Vitals:   09/08/12 0838  BP: 102/70  Pulse: 68  Temp: 98.7 F (37.1 C)  Resp: 16    HEENT: normocephalic; pupils equal and reactive; sclerae clear; dentition good; mucous membranes moist NECK:  symmetric on extension; no palpable anterior or posterior cervical lymphadenopathy; no supraclavicular masses; no tenderness CHEST: clear to auscultation bilaterally without rales, rhonchi, or wheezes CARDIAC: regular rate and rhythm without significant murmur; peripheral pulses are full ABDOMEN: soft without distension; bowel sounds present; no mass; no hepatosplenomegaly; no hernia; well-healed laparoscopic surgical incisions without evidence of herniation; mild tenderness to palpation right upper quadrant without palpable mass; no guarding; no rebound EXT:  non-tender without edema; no deformity NEURO: no gross focal deficits; no sign of tremor   LABORATORY RESULTS: See [facility] (CHL-Epic) for most recent results  RADIOLOGY RESULTS: See [facility] (CHL-Epic) for most recent results   IMPRESSION: #1 symptomatic cholelithiasis, probable chronic cholecystitis #2 abnormal liver function test #3 insulin-dependent diabetes  PLAN: I discussed the indications for surgery with the  patient. I provided her with written literature to review. We discussed laparoscopic cholecystectomy with intraoperative cholangiography. We discussed the hospital stay to be anticipated. We discussed the potential need for conversion to open surgery given her prior surgical procedures. She understands and wishes to proceed. We will make arrangements for surgery in the near future.  The risks and benefits of the procedure have been discussed at length with the patient.  The patient understands the proposed procedure, potential alternative treatments, and the course of recovery to be expected.  All of the patient's questions have been answered at this time.  The patient wishes to proceed with surgery.  Velora Heckler, MD, FACS General & Endocrine Surgery [facility]   Visit Diagnoses: 1. Cholelithiasis with cholecystitis     Primary Care Physician: Alysia Penna, MD

## 2012-09-08 NOTE — Patient Instructions (Signed)
  [facility]  LAPAROSCOPIC SURGERY - POST-OP INSTRUCTIONS  Always review your discharge instruction sheet given to you by the facility where your surgery was performed.  A prescription for pain medication may be given to you upon discharge.  Take your pain medication as prescribed.  If narcotic pain medicine is not needed, then you may take acetaminophen (Tylenol) or ibuprofen (Advil) as needed.  Take your usually prescribed medications unless otherwise directed.  If you need a refill on your pain medication, please contact your pharmacy.  They will contact our office to request authorization. Prescriptions will not be filled after 5 P.M. or on weekends.  You should follow a light diet the first few days after arrival home, such as soup and crackers or toast.  Be sure to include plenty of fluids daily.  Most patients will experience some swelling and bruising in the area of the incisions.  Ice packs will help.  Swelling and bruising can take several days to resolve.   It is common to experience some constipation if taking pain medication after surgery.  Increasing fluid intake and taking a stool softener (such as Colace) will usually help or prevent this problem from occurring.  A mild laxative (Milk of Magnesia or Miralax) should be taken according to package instructions if there are no bowel movements after 48 hours.  Unless discharge instructions indicate otherwise, you may remove your bandages 24-48 hours after surgery, and you may shower at that time.  You may have steri-strips (small skin tapes) in place directly over the incision.  These strips should be left on the skin for 7-10 days.  If your surgeon used skin glue on the incision, you may shower in 24 hours.  The glue will flake off over the next 2-3 weeks.  Any sutures or staples will be removed at the office during your follow-up visit.  ACTIVITIES:  You may resume regular (light) daily activities beginning the  next day-such as daily self-care, walking, climbing stairs-gradually increasing activities as tolerated.  You may have sexual intercourse when it is comfortable.  Refrain from any heavy lifting or straining until approved by your doctor.  You may drive when you are no longer taking prescription pain medication, you can comfortably wear a seatbelt, and you can safely maneuver your car and apply brakes.  You should see your doctor in the office for a follow-up appointment approximately 2-3 weeks after your surgery.  Make sure that you call for this appointment within a day or two after you arrive home to insure a convenient appointment time.  WHEN TO CALL YOUR DOCTOR: 1. Fever over 101.0 2. Inability to urinate 3. Continued bleeding from incision 4. Increased pain, redness, or drainage from the incision 5. Increasing abdominal pain  The clinic staff is available to answer your questions during regular business hours.  Please don't hesitate to call and ask to speak to one of the nurses for clinical concerns.  If you have a medical emergency, go to the nearest emergency room or call 911.  A surgeon from [facility] is always on call for the hospital.  Vanessa Seefeldt M. Reneta Niehaus, MD, FACS [facility] Office: [phone] Toll Free:  [phone] FAX [phone]  Web site: [email or web address] 

## 2012-09-21 ENCOUNTER — Encounter (INDEPENDENT_AMBULATORY_CARE_PROVIDER_SITE_OTHER): Payer: Self-pay

## 2012-12-07 ENCOUNTER — Encounter: Payer: Self-pay | Admitting: Internal Medicine

## 2012-12-07 ENCOUNTER — Other Ambulatory Visit: Payer: Self-pay | Admitting: Specialist

## 2012-12-07 DIAGNOSIS — Z1231 Encounter for screening mammogram for malignant neoplasm of breast: Secondary | ICD-10-CM

## 2012-12-09 ENCOUNTER — Ambulatory Visit
Admission: RE | Admit: 2012-12-09 | Discharge: 2012-12-09 | Disposition: A | Discharge status: Home / Self Care | Payer: BC Managed Care – PPO | Source: Ambulatory Visit | Attending: Specialist | Admitting: Specialist

## 2012-12-09 DIAGNOSIS — Z1231 Encounter for screening mammogram for malignant neoplasm of breast: Secondary | ICD-10-CM

## 2013-01-04 ENCOUNTER — Encounter: Payer: Self-pay | Admitting: Internal Medicine

## 2013-01-07 ENCOUNTER — Ambulatory Visit (INDEPENDENT_AMBULATORY_CARE_PROVIDER_SITE_OTHER): Payer: BC Managed Care – PPO | Admitting: Internal Medicine

## 2013-01-07 ENCOUNTER — Other Ambulatory Visit (INDEPENDENT_AMBULATORY_CARE_PROVIDER_SITE_OTHER): Payer: BC Managed Care – PPO

## 2013-01-07 ENCOUNTER — Encounter: Payer: Self-pay | Admitting: Internal Medicine

## 2013-01-07 VITALS — BP 110/60 | HR 96 | Ht 65.0 in | Wt 159.4 lb

## 2013-01-07 DIAGNOSIS — R195 Other fecal abnormalities: Secondary | ICD-10-CM

## 2013-01-07 LAB — CBC WITH DIFFERENTIAL/PLATELET
Basophils Absolute: 0 10*3/uL (ref 0.0–0.1)
Basophils Relative: 0.4 % (ref 0.0–3.0)
Eosinophils Relative: 2.6 % (ref 0.0–5.0)
HCT: 41.4 % (ref 36.0–46.0)
Hemoglobin: 14.1 g/dL (ref 12.0–15.0)
Lymphocytes Relative: 31 % (ref 12.0–46.0)
Lymphs Abs: 1.8 10*3/uL (ref 0.7–4.0)
Monocytes Relative: 7.5 % (ref 3.0–12.0)
Neutro Abs: 3.3 10*3/uL (ref 1.4–7.7)
RBC: 4.92 Mil/uL (ref 3.87–5.11)
RDW: 13.6 % (ref 11.5–14.6)
WBC: 5.7 10*3/uL (ref 4.5–10.5)

## 2013-01-07 LAB — FERRITIN: Ferritin: 34.9 ng/mL (ref 10.0–291.0)

## 2013-01-07 LAB — IBC PANEL: Saturation Ratios: 21.3 % (ref 20.0–50.0)

## 2013-01-07 MED ORDER — PEG-KCL-NACL-NASULF-NA ASC-C 100 G PO SOLR: 1.0000 | Freq: Once | ORAL | Status: DC

## 2013-01-07 NOTE — Progress Notes (Signed)
Patient ID: Vanessa Cole, female   DOB: 23-Feb-1961, 52 y.o.   MRN: [id number] HPI: Mrs. Bruinsma is a 52 yo female with PMH of diabetes, GERD status post Nissen fundoplication in 2000 by Dr. Daphine Deutscher, hyperlipidemia, history of microcytic anemia which was resolved as of May 2014 who is seen in consultation at the request of Jetta Lout, CNM for evaluation of heme positive stool. He reports she is feeling well and has not seen any visible blood in her stool and also denies melena. She denies recent abdominal pain. She does report a long-standing history of on-and-off constipation, though of late her stools have been regular and soft. She reports having one formed brown stool daily. She denies diarrhea. Appetite is good, no unexpected weight loss. No nausea or vomiting. No heartburn since repair of her hiatal hernia and 2009. She denies dysphagia or odynophagia. No early satiety. She does have a history of diabetes and she takes insulin and metformin for this condition. She says for the most part her sugars are well controlled.  She did have a normal colonoscopy performed in 2004 by Dr. Danise Edge which was normal. Of note the indication was why a positive stool and microcytic anemia  She denies a family history of CRC  Past Medical History  Diagnosis Date  . Diabetes mellitus without complication   . GERD (gastroesophageal reflux disease)   . Hyperlipidemia     Past Surgical History  Procedure Laterality Date  . Appendectomy    . Nissen fundoplication    . Foot surgery    . Knee arthroscopy    . Ulcer surgery    . Hernia repair      Current Outpatient Prescriptions  Medication Sig Dispense Refill  . aspirin 81 MG tablet Take 81 mg by mouth daily.      Marland Kitchen doxycycline (VIBRA-TABS) 100 MG tablet Take 100 mg by mouth 2 (two) times daily. For rosacia      . insulin glargine (LANTUS) 100 UNIT/ML injection Inject 14 Units into the skin at bedtime.      . metFORMIN (GLUCOPHAGE) 500 MG  tablet Take 1,000 mg by mouth 2 (two) times daily with a meal.       Current Facility-Administered Medications  Medication Dose Route Frequency Provider Last Rate Last Dose  . dexamethasone (DECADRON) injection 4 mg  4 mg Intravenous Once Myeong Sheard, DPM      . triamcinolone acetonide (KENALOG-40) injection 4 mg  4 mg Intramuscular Once Myeong Sheard, DPM        Allergies  Allergen Reactions  . Morphine And Related Hives and Itching    Family History  Problem Relation Age of Onset  . Diabetes      History  Substance Use Topics  . Smoking status: Never Smoker   . Smokeless tobacco: Never Used  . Alcohol Use: No    ROS: As per history of present illness, otherwise negative  BP 110/60  Pulse 96  Ht 5\' 5"  (1.651 m)  Wt 159 lb 6.4 oz (72.303 kg)  BMI 26.53 kg/m2  SpO2 98% Constitutional: Well-developed and well-nourished. No distress. HEENT: Normocephalic and atraumatic. Oropharynx is clear and moist. No oropharyngeal exudate. Conjunctivae are normal.  No scleral icterus. Neck: Neck supple. Trachea midline. Cardiovascular: Normal rate, regular rhythm and intact distal pulses. 2/6 SEM Pulmonary/chest: Effort normal and breath sounds normal. No wheezing, rales or rhonchi. Abdominal: Soft, nontender, nondistended. Bowel sounds active throughout.  Extremities: no clubbing, cyanosis, or edema Lymphadenopathy: No cervical adenopathy  noted. Neurological: Alert and oriented to person place and time. Skin: Skin is warm and dry. No rashes noted. Psychiatric: Normal mood and affect. Behavior is normal.  RELEVANT LABS AND IMAGING: CBC    Component Value Date/Time   WBC 7.0 10/04/2011 2347   RBC 5.33* 10/04/2011 2347   HGB 15.6* 08/04/2012 1409   HCT 46.0 08/04/2012 1409   PLT 273 10/04/2011 2347   MCV 83.9 10/04/2011 2347   MCH 28.9 10/04/2011 2347   MCHC 34.5 10/04/2011 2347   RDW 13.2 10/04/2011 2347   LYMPHSABS 2.9 10/04/2011 2347   MONOABS 0.6 10/04/2011 2347   EOSABS 0.2  10/04/2011 2347   BASOSABS 0.0 10/04/2011 2347    CMP     Component Value Date/Time   NA 136 08/04/2012 1409   K 4.2 08/04/2012 1409   CL 101 08/04/2012 1409   CO2 29 10/04/2011 2347   GLUCOSE 429* 08/04/2012 1409   BUN 12 08/04/2012 1409   CREATININE 0.50 08/04/2012 1409   CALCIUM 10.0 10/04/2011 2347   PROT 7.7 10/04/2011 2347   ALBUMIN 4.6 10/04/2011 2347   AST 18 10/04/2011 2347   ALT 15 10/04/2011 2347   ALKPHOS 102 10/04/2011 2347   BILITOT 0.4 10/04/2011 2347   GFRNONAA >90 10/04/2011 2347   GFRAA >90 10/04/2011 2347   Colonoscopy 12/17/2002 -- exam to the cecum, normal.  ASSESSMENT/PLAN:  52 yo female with PMH of diabetes, GERD status post Nissen fundoplication in 2000 by Dr. Daphine Deutscher, hyperlipidemia, history of iron deficiency anemia which was resolved as of May 2014 who is seen in consultation at the request of Jetta Lout, CNM for evaluation of heme positive stool  1.  CRC screening/heme positive stool -- we have discussed repeat colonoscopy for screening and to evaluate her heme positive stool as found by her GYN provider on DRE.  After discussion of the risks and benefits she is agreeable to proceed. I will check a CBC today as well as iron studies to ensure she is not anemic nor iron deficient given her history and recent heme-positive stool.

## 2013-01-07 NOTE — Patient Instructions (Addendum)
Your physician has requested that you go to the basement for the following lab work before leaving today:CBC and Iron studies.    You have been scheduled for a colonoscopy with propofol. Please follow written instructions given to you at your visit today.  Please pick up your prep kit at the pharmacy within the next 1-3 days.  cc: Alysia Penna, MD

## 2013-01-19 ENCOUNTER — Ambulatory Visit (AMBULATORY_SURGERY_CENTER): Payer: BC Managed Care – PPO | Admitting: Internal Medicine

## 2013-01-19 ENCOUNTER — Encounter: Payer: Self-pay | Admitting: Internal Medicine

## 2013-01-19 VITALS — BP 118/75 | HR 85 | Temp 98.1°F | Resp 34 | Ht 65.0 in | Wt 159.0 lb

## 2013-01-19 DIAGNOSIS — R195 Other fecal abnormalities: Secondary | ICD-10-CM

## 2013-01-19 DIAGNOSIS — Z1211 Encounter for screening for malignant neoplasm of colon: Secondary | ICD-10-CM

## 2013-01-19 DIAGNOSIS — D126 Benign neoplasm of colon, unspecified: Secondary | ICD-10-CM

## 2013-01-19 LAB — GLUCOSE, CAPILLARY: Glucose-Capillary: 114 mg/dL — ABNORMAL HIGH (ref 70–99)

## 2013-01-19 MED ORDER — SODIUM CHLORIDE 0.9 % IV SOLN: 500.0000 mL | INTRAVENOUS | Status: DC

## 2013-01-19 NOTE — Progress Notes (Signed)
Called to room to assist during endoscopic procedure.  Patient ID and intended procedure confirmed with present staff. Received instructions for my participation in the procedure from the performing physician.  

## 2013-01-19 NOTE — Progress Notes (Signed)
Patient did not experience any of the following events: a burn prior to discharge; a fall within the facility; wrong site/side/patient/procedure/implant event; or a hospital transfer or hospital admission upon discharge from the facility. (G8907) Patient did not have preoperative order for IV antibiotic SSI prophylaxis. (G8918)  

## 2013-01-19 NOTE — Op Note (Signed)
[facility] [address] [place], [address]   COLONOSCOPY PROCEDURE REPORT  PATIENT: Vanessa, Cole  MR#: [id number] BIRTHDATE: 1960/11/20 , 52  yrs. old GENDER: Female ENDOSCOPIST: Beverley Fiedler, MD REFERRED BY: Jetta Lout, CNM PROCEDURE DATE:  01/19/2013 PROCEDURE:   Colonoscopy with cold biopsy polypectomy First Screening Colonoscopy - Avg.  risk and is 50 yrs.  old or older - No.  Prior Negative Screening - Now for repeat screening. 10 or more years since last screening  History of Adenoma - Now for follow-up colonoscopy & has been > or = to 3 yrs.  N/A  Polyps Removed Today? Yes. ASA CLASS:   Class II INDICATIONS:average risk screening and heme-positive stool. MEDICATIONS: MAC sedation, administered by CRNA and propofol (Diprivan) 300mg  IV  DESCRIPTION OF PROCEDURE:   After the risks benefits and alternatives of the procedure were thoroughly explained, informed consent was obtained.  A digital rectal exam revealed external hemorrhoids.   The LB [id number]  endoscope was introduced through the anus and advanced to the cecum, which was identified by both the appendix and ileocecal valve. No adverse events experienced.   The quality of the prep was Moviprep fair  The instrument was then slowly withdrawn as the colon was fully examined.  COLON FINDINGS: Five sessile polyps ranging between 3-72mm in size were found at the cecum (1), in the transverse colon (2), and descending colon (2).  Polypectomy was performed with cold forceps. All resections were complete and all polyp tissue was completely retrieved.   Mild diverticulosis was noted in the descending colon and sigmoid colon.  Retroflexed views revealed no abnormalities. The time to cecum=3 minutes 40 seconds.  Withdrawal time=12 minutes 57 seconds.  The scope was withdrawn and the procedure completed. COMPLICATIONS: There were no complications.  ENDOSCOPIC IMPRESSION: 1.   Five sessile  polyps ranging between 3-40mm in size were found at the cecum, in the transverse colon, and descending colon; Polypectomy was performed with cold forceps 2.   Mild diverticulosis was noted in the descending colon and sigmoid colon  RECOMMENDATIONS: 1.  Await pathology results 2.  High fiber diet 3.  Timing of repeat colonoscopy will be determined by pathology findings. 4.  You will receive a letter within 1-2 weeks with the results of your biopsy as well as final recommendations.  Please call my office if you have not received a letter after 3 weeks.   eSigned:  Beverley Fiedler, MD 01/19/2013 9:24 AM cc: The Patient; Jetta Lout, CNM, Alysia Penna, MD

## 2013-01-19 NOTE — Progress Notes (Signed)
Report to pacu rn, vss, bbs=clear, replaced monitor bp at beginning of case.Vanessa KitchenMarland Cole

## 2013-01-19 NOTE — Patient Instructions (Signed)
Findings:  Polyps, Mild diverticulosis Recommendations:  High Fiber Diet, Repeat colonoscopy will be determined by pathology results  YOU HAD AN ENDOSCOPIC PROCEDURE TODAY AT THE [facility]: Refer to the procedure report that was given to you for any specific questions about what was found during the examination.  If the procedure report does not answer your questions, please call your gastroenterologist to clarify.  If you requested that your care partner not be given the details of your procedure findings, then the procedure report has been included in a sealed envelope for you to review at your convenience later.  YOU SHOULD EXPECT: Some feelings of bloating in the abdomen. Passage of more gas than usual.  Walking can help get rid of the air that was put into your GI tract during the procedure and reduce the bloating. If you had a lower endoscopy (such as a colonoscopy or flexible sigmoidoscopy) you may notice spotting of blood in your stool or on the toilet paper. If you underwent a bowel prep for your procedure, then you may not have a normal bowel movement for a few days.  DIET: Your first meal following the procedure should be a light meal and then it is ok to progress to your normal diet.  A half-sandwich or bowl of soup is an example of a good first meal.  Heavy or fried foods are harder to digest and may make you feel nauseous or bloated.  Likewise meals heavy in dairy and vegetables can cause extra gas to form and this can also increase the bloating.  Drink plenty of fluids but you should avoid alcoholic beverages for 24 hours.  ACTIVITY: Your care partner should take you home directly after the procedure.  You should plan to take it easy, moving slowly for the rest of the day.  You can resume normal activity the day after the procedure however you should NOT DRIVE or use heavy machinery for 24 hours (because of the sedation medicines used during the test).    SYMPTOMS TO REPORT  IMMEDIATELY: A gastroenterologist can be reached at any hour.  During normal business hours, 8:30 AM to 5:00 PM Monday through Friday, call [phone].  After hours and on weekends, please call the GI answering service at [phone] who will take a message and have the physician on call contact you.   Following lower endoscopy (colonoscopy or flexible sigmoidoscopy):  Excessive amounts of blood in the stool  Significant tenderness or worsening of abdominal pains  Swelling of the abdomen that is new, acute  Fever of 100F or higher  Following upper endoscopy (EGD)  Vomiting of blood or coffee ground material  New chest pain or pain under the shoulder blades  Painful or persistently difficult swallowing  New shortness of breath  Fever of 100F or higher  Black, tarry-looking stools  FOLLOW UP: If any biopsies were taken you will be contacted by phone or by letter within the next 1-3 weeks.  Call your gastroenterologist if you have not heard about the biopsies in 3 weeks.  Our staff will call the home number listed on your records the next business day following your procedure to check on you and address any questions or concerns that you may have at that time regarding the information given to you following your procedure. This is a courtesy call and so if there is no answer at the home number and we have not heard from you through the emergency physician on call, we  will assume that you have returned to your regular daily activities without incident.  SIGNATURES/CONFIDENTIALITY: You and/or your care partner have signed paperwork which will be entered into your electronic medical record.  These signatures attest to the fact that that the information above on your After Visit Summary has been reviewed and is understood.  Full responsibility of the confidentiality of this discharge information lies with you and/or your care-partner.  Please follow all discharge instructions given to you  by the recovery room nurse. If you have any questions or problems after discharge please call one of the numbers listed above. You will receive a phone call in the am to see how you are doing and answer any questions you may have. Thank you for choosing [facility] for your health care needs.

## 2013-01-20 ENCOUNTER — Telehealth: Payer: Self-pay | Admitting: *Deleted

## 2013-01-20 NOTE — Telephone Encounter (Signed)
  Follow up Call-  Call back number 01/19/2013  Post procedure Call Back phone  # [phone]  Permission to leave phone message Yes     Patient questions:  Do you have a fever, pain , or abdominal swelling? no Pain Score  0 *  Have you tolerated food without any problems? yes  Have you been able to return to your normal activities? yes  Do you have any questions about your discharge instructions: Diet   no Medications  no Follow up visit  no  Do you have questions or concerns about your Care? no  Actions: * If pain score is 4 or above: No action needed, pain <4.

## 2013-01-25 ENCOUNTER — Encounter: Payer: Self-pay | Admitting: Internal Medicine

## 2013-07-15 ENCOUNTER — Ambulatory Visit: Payer: BC Managed Care – PPO | Admitting: Podiatry

## 2013-11-29 ENCOUNTER — Other Ambulatory Visit: Payer: Self-pay

## 2013-11-29 DIAGNOSIS — Z1231 Encounter for screening mammogram for malignant neoplasm of breast: Secondary | ICD-10-CM

## 2013-12-16 ENCOUNTER — Ambulatory Visit
Admission: RE | Admit: 2013-12-16 | Discharge: 2013-12-16 | Disposition: A | Discharge status: Home / Self Care | Payer: BC Managed Care – PPO | Source: Ambulatory Visit

## 2013-12-16 DIAGNOSIS — Z1231 Encounter for screening mammogram for malignant neoplasm of breast: Secondary | ICD-10-CM

## 2014-04-05 ENCOUNTER — Ambulatory Visit (INDEPENDENT_AMBULATORY_CARE_PROVIDER_SITE_OTHER): Payer: Self-pay | Admitting: Surgery

## 2014-04-08 ENCOUNTER — Encounter (HOSPITAL_COMMUNITY): Payer: Self-pay

## 2014-04-08 ENCOUNTER — Other Ambulatory Visit (HOSPITAL_COMMUNITY): Payer: Self-pay | Admitting: *Deleted

## 2014-04-08 NOTE — Patient Instructions (Addendum)
Kodee Drury  04/08/2014   Your procedure is scheduled on: 04/14/14   Report to [facility] Main  Entrance and follow signs to               [place] at 5:30  AM.   Call this number if you have problems the morning of surgery [phone]   Remember:  Do not eat food or drink liquids :After Midnight.     Take these medicines the morning of surgery with A SIP OF WATER: none             STOP ASPIRIN AND HERBAL MEDS  / VITAMINS 5 DAYS PREOP                               You may not have any metal on your body including hair pins and              piercings  Do not wear jewelry, make-up, lotions, powders or perfumes.             Do not wear nail polish.  Do not shave  48 hours prior to surgery.              Men may shave face and neck.   Do not bring valuables to the hospital. [facility] [place].  Contacts, dentures or bridgework may not be worn into surgery.  Leave suitcase in the car. After surgery it may be brought to your room.     Patients discharged the day of surgery will not be allowed to drive home.  Name and phone number of your driver:  Special Instructions: N/A              Please read over the following fact sheets you were given: _____________________________________________________________________                                                     [place]  Before surgery, you can play an important role.  Because skin is not sterile, your skin needs to be as free of germs as possible.  You can reduce the number of germs on your skin by washing with CHG (chlorahexidine gluconate) soap before surgery.  CHG is an antiseptic cleaner which kills germs and bonds with the skin to continue killing germs even after washing. Please DO NOT use if you have an allergy to CHG or antibacterial soaps.  If your skin becomes reddened/irritated stop using the CHG and inform your  nurse when you arrive at Short Stay. Do not shave (including legs and underarms) for at least 48 hours prior to the first CHG shower.  You may shave your face. Please follow these instructions carefully:   1.  Shower with CHG Soap the night before surgery and the  morning of Surgery.   2.  If you choose to wash your hair, wash your hair first as usual with your  normal  Shampoo.   3.  After you shampoo, rinse your hair and body thoroughly to remove the  shampoo.  4.  Use CHG as you would any other liquid soap.  You can apply chg directly  to the skin and wash . Gently wash with scrungie or clean wascloth    5.  Apply the CHG Soap to your body ONLY FROM THE NECK DOWN.   Do not use on open                           Wound or open sores. Avoid contact with eyes, ears mouth and genitals (private parts).                        Genitals (private parts) with your normal soap.              6.  Wash thoroughly, paying special attention to the area where your surgery  will be performed.   7.  Thoroughly rinse your body with warm water from the neck down.   8.  DO NOT shower/wash with your normal soap after using and rinsing off  the CHG Soap .                9.  Pat yourself dry with a clean towel.             10.  Wear clean pajamas.             11.  Place clean sheets on your bed the night of your first shower and do not  sleep with pets.  Day of Surgery : Do not apply any lotions/deodorants the morning of surgery.  Please wear clean clothes to the hospital/surgery center.  FAILURE TO FOLLOW THESE INSTRUCTIONS MAY RESULT IN THE CANCELLATION OF YOUR SURGERY    PATIENT SIGNATURE_________________________________  ______________________________________________________________________

## 2014-04-11 ENCOUNTER — Encounter (HOSPITAL_COMMUNITY)
Admission: RE | Admit: 2014-04-11 | Discharge: 2014-04-11 | Disposition: A | Discharge status: Home / Self Care | Payer: 59 | Source: Ambulatory Visit | Attending: Surgery | Admitting: Surgery

## 2014-04-11 ENCOUNTER — Encounter (HOSPITAL_COMMUNITY): Payer: Self-pay

## 2014-04-11 DIAGNOSIS — K8010 Calculus of gallbladder with chronic cholecystitis without obstruction: Secondary | ICD-10-CM | POA: Diagnosis not present

## 2014-04-11 DIAGNOSIS — Z885 Allergy status to narcotic agent status: Secondary | ICD-10-CM | POA: Diagnosis not present

## 2014-04-11 DIAGNOSIS — Z794 Long term (current) use of insulin: Secondary | ICD-10-CM | POA: Diagnosis not present

## 2014-04-11 DIAGNOSIS — Z7982 Long term (current) use of aspirin: Secondary | ICD-10-CM | POA: Diagnosis not present

## 2014-04-11 DIAGNOSIS — Z79899 Other long term (current) drug therapy: Secondary | ICD-10-CM | POA: Diagnosis not present

## 2014-04-11 HISTORY — DX: Fibromyalgia: M79.7

## 2014-04-11 HISTORY — DX: Rosacea, unspecified: L71.9

## 2014-04-11 HISTORY — DX: Calculus of gallbladder without cholecystitis without obstruction: K80.20

## 2014-04-13 ENCOUNTER — Encounter (HOSPITAL_COMMUNITY): Payer: Self-pay | Admitting: Surgery

## 2014-04-13 NOTE — H&P (Signed)
  General Surgery [facility]  Vanessa Cole 04/05/2014 4:24 PM DOB: Jan 26, 1961 Married / Language: English / Race: American [place] or [place] Native Female  History of Present Illness Vanessa Regal MD; 04/05/2014 5:01 PM)  The patient is a 53 year old female who presents for evaluation of gall stones. The patient returns yet again to discuss scheduling of her cholecystectomy. Patient has been followed intermittently since 2014. She was seen here in November 2015 to be scheduled for surgery [place]. Her insurance is now changed over the new year and therefore she requires evaluation and scheduling of her procedure at this time. Patient has been having intermittent right upper quadrant and right mid abdominal pain. She denies fevers or chills. She presents today to schedule laparoscopic or possible open cholecystectomy.   Allergies Clarise Cruz Forsan, LPN; 1/85/6314 9:70 PM) Morphine Sulfate (PF) *ANALGESICS - OPIOID*  Medication History Sharman Crate, LPN; 2/63/7858 8:50 PM) MetFORMIN HCl (1000MG  Tablet, Oral) Active. Baby Aspirin (81MG  Tablet Chewable, Oral) Active. Doxycycline Hyclate (100MG  Capsule, Oral) Active. Lantus (100UNIT/ML Solution, Subcutaneous daily) Active.  Vitals Clarise Cruz [facility] LPN; 2/77/4128 7:86 PM) 04/05/2014 4:24 PM Weight: 157.5 lb Height: 61in Body Surface Area: 1.75 m Body Mass Index: 29.76 kg/m Temp.: 98.31F(Oral)  Pulse: 78 (Regular)  BP: 132/78 (Sitting, Left Arm, Standard)   Physical Exam Vanessa Regal MD; 04/05/2014 5:02 PM)  General - appears comfortable, no distress; not diaphorectic  HEENT - normocephalic; sclerae clear, gaze conjugate; mucous membranes moist, dentition good; voice normal  Neck - symmetric on extension; no palpable anterior or posterior cervical adenopathy; no palpable masses in the thyroid bed  Chest - clear bilaterally with rhonchi, rales, or wheeze  Cor - regular  rhythm with normal rate; no significant murmur  Abd - soft without distension; mild tenderness to deep palpation right upper quadrant and right mid abdomen without palpable mass; no guarding; multiple well-healed surgical incisions  Ext - non-tender without significant edema or lymphedema  Neuro - grossly intact; no tremor    Assessment & Plan Vanessa Regal MD; 04/05/2014 5:03 PM)  CHRONIC CHOLECYSTITIS WITH CALCULUS (574.10  K80.10)  The patient and I again discussed cholecystectomy. I believe she can be done successfully by laparoscopic technique, but given her prior abdominal surgeries, she may require open cholecystectomy. We again discussed this possibility. Patient is having intermittent symptoms and is mildly tender on examination today. I would like to proceed with her cholecystectomy in the near future. Patient is in agreement.  Patient had an insurance change over the new year. She is now with [employer]. We will attempt to schedule her in the immediate future.  The risks and benefits of the procedure have been discussed at length with the patient. The patient understands the proposed procedure, potential alternative treatments, and the course of recovery to be expected. All of the patient's questions have been answered at this time. The patient wishes to proceed with surgery.  Vanessa Regal, MD, [facility] Office: [phone]

## 2014-04-14 ENCOUNTER — Encounter (HOSPITAL_COMMUNITY)
Admission: RE | Disposition: A | Discharge status: Home / Self Care | Payer: Self-pay | Source: Ambulatory Visit | Attending: Surgery

## 2014-04-14 ENCOUNTER — Ambulatory Visit (HOSPITAL_COMMUNITY): Payer: 59 | Admitting: Certified Registered Nurse Anesthetist

## 2014-04-14 ENCOUNTER — Encounter (HOSPITAL_COMMUNITY): Payer: Self-pay | Admitting: *Deleted

## 2014-04-14 ENCOUNTER — Observation Stay (HOSPITAL_COMMUNITY)
Admission: RE | Admit: 2014-04-14 | Discharge: 2014-04-15 | Disposition: A | Discharge status: Home / Self Care | Payer: 59 | Source: Ambulatory Visit | Attending: Surgery | Admitting: Surgery

## 2014-04-14 ENCOUNTER — Ambulatory Visit (HOSPITAL_COMMUNITY): Payer: 59

## 2014-04-14 DIAGNOSIS — K8010 Calculus of gallbladder with chronic cholecystitis without obstruction: Principal | ICD-10-CM | POA: Insufficient documentation

## 2014-04-14 DIAGNOSIS — Z419 Encounter for procedure for purposes other than remedying health state, unspecified: Secondary | ICD-10-CM

## 2014-04-14 DIAGNOSIS — Z794 Long term (current) use of insulin: Secondary | ICD-10-CM | POA: Insufficient documentation

## 2014-04-14 DIAGNOSIS — Z79899 Other long term (current) drug therapy: Secondary | ICD-10-CM | POA: Insufficient documentation

## 2014-04-14 DIAGNOSIS — Z885 Allergy status to narcotic agent status: Secondary | ICD-10-CM | POA: Insufficient documentation

## 2014-04-14 DIAGNOSIS — Z7982 Long term (current) use of aspirin: Secondary | ICD-10-CM | POA: Insufficient documentation

## 2014-04-14 HISTORY — PX: CHOLECYSTECTOMY: SHX55

## 2014-04-14 LAB — GLUCOSE, CAPILLARY
GLUCOSE-CAPILLARY: 130 mg/dL — AB (ref 70–99)
GLUCOSE-CAPILLARY: 203 mg/dL — AB (ref 70–99)
Glucose-Capillary: 274 mg/dL — ABNORMAL HIGH (ref 70–99)

## 2014-04-14 SURGERY — LAPAROSCOPIC CHOLECYSTECTOMY WITH INTRAOPERATIVE CHOLANGIOGRAM
Anesthesia: General | Site: Abdomen

## 2014-04-14 MED ORDER — ONDANSETRON HCL 4 MG/2ML IJ SOLN
INTRAMUSCULAR | Status: DC | PRN
  Administered 2014-04-14: 4 mg via INTRAVENOUS

## 2014-04-14 MED ORDER — LIDOCAINE HCL (CARDIAC) 20 MG/ML IV SOLN
INTRAVENOUS | Status: DC | PRN
  Administered 2014-04-14: 100 mg via INTRAVENOUS

## 2014-04-14 MED ORDER — ROCURONIUM BROMIDE 100 MG/10ML IV SOLN
INTRAVENOUS | Status: AC
  Filled 2014-04-14: qty 1

## 2014-04-14 MED ORDER — METOCLOPRAMIDE HCL 5 MG/ML IJ SOLN
INTRAMUSCULAR | Status: DC | PRN
  Administered 2014-04-14: 10 mg via INTRAVENOUS

## 2014-04-14 MED ORDER — ONDANSETRON HCL 4 MG PO TABS: 4.0000 mg | ORAL_TABLET | Freq: Four times a day (QID) | ORAL | Status: DC | PRN

## 2014-04-14 MED ORDER — ONDANSETRON HCL 4 MG/2ML IJ SOLN
INTRAMUSCULAR | Status: AC
  Filled 2014-04-14: qty 2

## 2014-04-14 MED ORDER — CEFAZOLIN SODIUM-DEXTROSE 2-3 GM-% IV SOLR
2.0000 g | INTRAVENOUS | Status: AC
  Administered 2014-04-14: 2 g via INTRAVENOUS

## 2014-04-14 MED ORDER — DEXAMETHASONE SODIUM PHOSPHATE 10 MG/ML IJ SOLN
INTRAMUSCULAR | Status: DC | PRN
  Administered 2014-04-14: 10 mg via INTRAVENOUS

## 2014-04-14 MED ORDER — LACTATED RINGERS IV SOLN
INTRAVENOUS | Status: DC | PRN
  Administered 2014-04-14: 07:00:00 via INTRAVENOUS

## 2014-04-14 MED ORDER — LIDOCAINE HCL (CARDIAC) 20 MG/ML IV SOLN
INTRAVENOUS | Status: AC
  Filled 2014-04-14: qty 5

## 2014-04-14 MED ORDER — ROCURONIUM BROMIDE 100 MG/10ML IV SOLN
INTRAVENOUS | Status: DC | PRN
  Administered 2014-04-14: 35 mg via INTRAVENOUS

## 2014-04-14 MED ORDER — BUPIVACAINE-EPINEPHRINE (PF) 0.25% -1:200000 IJ SOLN
INTRAMUSCULAR | Status: DC | PRN
  Administered 2014-04-14: 20 mL via PERINEURAL

## 2014-04-14 MED ORDER — 0.9 % SODIUM CHLORIDE (POUR BTL) OPTIME
TOPICAL | Status: DC | PRN
  Administered 2014-04-14: 1000 mL

## 2014-04-14 MED ORDER — DEXAMETHASONE SODIUM PHOSPHATE 10 MG/ML IJ SOLN
INTRAMUSCULAR | Status: AC
  Filled 2014-04-14: qty 1

## 2014-04-14 MED ORDER — GLYCOPYRROLATE 0.2 MG/ML IJ SOLN
INTRAMUSCULAR | Status: DC | PRN
  Administered 2014-04-14: 0.6 mg via INTRAVENOUS

## 2014-04-14 MED ORDER — FENTANYL CITRATE 0.05 MG/ML IJ SOLN
INTRAMUSCULAR | Status: AC
  Filled 2014-04-14: qty 5

## 2014-04-14 MED ORDER — MIDAZOLAM HCL 2 MG/2ML IJ SOLN
INTRAMUSCULAR | Status: AC
  Filled 2014-04-14: qty 2

## 2014-04-14 MED ORDER — BUPIVACAINE-EPINEPHRINE (PF) 0.25% -1:200000 IJ SOLN
INTRAMUSCULAR | Status: AC
  Filled 2014-04-14: qty 30

## 2014-04-14 MED ORDER — PROPOFOL 10 MG/ML IV BOLUS
INTRAVENOUS | Status: AC
  Filled 2014-04-14: qty 20

## 2014-04-14 MED ORDER — HYDROMORPHONE HCL 1 MG/ML IJ SOLN
1.0000 mg | INTRAMUSCULAR | Status: DC | PRN
  Administered 2014-04-14 – 2014-04-15 (×3): 1 mg via INTRAVENOUS
  Filled 2014-04-14 (×3): qty 1

## 2014-04-14 MED ORDER — METOCLOPRAMIDE HCL 5 MG/ML IJ SOLN
INTRAMUSCULAR | Status: AC
  Filled 2014-04-14: qty 2

## 2014-04-14 MED ORDER — EPHEDRINE SULFATE 50 MG/ML IJ SOLN
INTRAMUSCULAR | Status: DC | PRN
  Administered 2014-04-14: 10 mg via INTRAVENOUS

## 2014-04-14 MED ORDER — FENTANYL CITRATE 0.05 MG/ML IJ SOLN
25.0000 ug | INTRAMUSCULAR | Status: DC | PRN
  Administered 2014-04-14 (×3): 25 ug via INTRAVENOUS
  Administered 2014-04-14: 50 ug via INTRAVENOUS

## 2014-04-14 MED ORDER — ONDANSETRON HCL 4 MG/2ML IJ SOLN
4.0000 mg | Freq: Four times a day (QID) | INTRAMUSCULAR | Status: DC | PRN
  Administered 2014-04-15: 4 mg via INTRAVENOUS
  Filled 2014-04-14 (×2): qty 2

## 2014-04-14 MED ORDER — METFORMIN HCL 500 MG PO TABS
500.0000 mg | ORAL_TABLET | Freq: Two times a day (BID) | ORAL | Status: DC
  Administered 2014-04-14: 500 mg via ORAL
  Filled 2014-04-14 (×4): qty 1

## 2014-04-14 MED ORDER — MIDAZOLAM HCL 5 MG/5ML IJ SOLN
INTRAMUSCULAR | Status: DC | PRN
  Administered 2014-04-14: 2 mg via INTRAVENOUS

## 2014-04-14 MED ORDER — SUCCINYLCHOLINE CHLORIDE 20 MG/ML IJ SOLN
INTRAMUSCULAR | Status: DC | PRN
  Administered 2014-04-14: 100 mg via INTRAVENOUS

## 2014-04-14 MED ORDER — NEOSTIGMINE METHYLSULFATE 10 MG/10ML IV SOLN
INTRAVENOUS | Status: DC | PRN
  Administered 2014-04-14: 5 mg via INTRAVENOUS

## 2014-04-14 MED ORDER — PROPOFOL 10 MG/ML IV BOLUS
INTRAVENOUS | Status: DC | PRN
  Administered 2014-04-14: 130 mg via INTRAVENOUS

## 2014-04-14 MED ORDER — KCL IN DEXTROSE-NACL 20-5-0.45 MEQ/L-%-% IV SOLN
INTRAVENOUS | Status: DC
  Administered 2014-04-14: 50 mL via INTRAVENOUS
  Administered 2014-04-14: 12:00:00 via INTRAVENOUS
  Filled 2014-04-14 (×2): qty 1000

## 2014-04-14 MED ORDER — INSULIN GLARGINE 100 UNIT/ML ~~LOC~~ SOLN
24.0000 [IU] | Freq: Every day | SUBCUTANEOUS | Status: DC
  Filled 2014-04-14 (×2): qty 0.24

## 2014-04-14 MED ORDER — FENTANYL CITRATE 0.05 MG/ML IJ SOLN
INTRAMUSCULAR | Status: AC
  Filled 2014-04-14: qty 2

## 2014-04-14 MED ORDER — FENTANYL CITRATE 0.05 MG/ML IJ SOLN
INTRAMUSCULAR | Status: DC | PRN
  Administered 2014-04-14 (×5): 50 ug via INTRAVENOUS

## 2014-04-14 MED ORDER — HYDROCODONE-ACETAMINOPHEN 5-325 MG PO TABS
1.0000 | ORAL_TABLET | ORAL | Status: DC | PRN
  Administered 2014-04-14: 1 via ORAL
  Filled 2014-04-14: qty 1

## 2014-04-14 MED ORDER — LACTATED RINGERS IR SOLN
Status: DC | PRN
  Administered 2014-04-14: 1000 mL

## 2014-04-14 MED ORDER — HYDROMORPHONE HCL 1 MG/ML IJ SOLN
INTRAMUSCULAR | Status: AC
  Filled 2014-04-14: qty 1

## 2014-04-14 MED ORDER — CEFAZOLIN SODIUM-DEXTROSE 2-3 GM-% IV SOLR
INTRAVENOUS | Status: AC
  Filled 2014-04-14: qty 50

## 2014-04-14 MED ORDER — LACTATED RINGERS IV SOLN
INTRAVENOUS | Status: DC
  Administered 2014-04-14: 10:00:00 via INTRAVENOUS

## 2014-04-14 MED ORDER — MEPERIDINE HCL 50 MG/ML IJ SOLN: 6.2500 mg | INTRAMUSCULAR | Status: DC | PRN

## 2014-04-14 MED ORDER — IOHEXOL 300 MG/ML  SOLN
INTRAMUSCULAR | Status: DC | PRN
  Administered 2014-04-14: 5 mL via INTRAVENOUS

## 2014-04-14 MED ORDER — ACETAMINOPHEN 325 MG PO TABS
650.0000 mg | ORAL_TABLET | ORAL | Status: DC | PRN
  Administered 2014-04-14: 650 mg via ORAL
  Filled 2014-04-14: qty 2

## 2014-04-14 MED ORDER — PROMETHAZINE HCL 25 MG/ML IJ SOLN: 6.2500 mg | INTRAMUSCULAR | Status: DC | PRN

## 2014-04-14 SURGICAL SUPPLY — 36 items
APPLIER CLIP ROT 10 11.4 M/L (STAPLE) ×2
BENZOIN TINCTURE PRP APPL 2/3 (GAUZE/BANDAGES/DRESSINGS) IMPLANT
CABLE HIGH FREQUENCY MONO STRZ (ELECTRODE) ×2 IMPLANT
CHLORAPREP W/TINT 26ML (MISCELLANEOUS) ×2 IMPLANT
CLIP APPLIE ROT 10 11.4 M/L (STAPLE) ×1 IMPLANT
COVER MAYO STAND STRL (DRAPES) ×2 IMPLANT
DECANTER SPIKE VIAL GLASS SM (MISCELLANEOUS) ×2 IMPLANT
DRAPE C-ARM 42X120 X-RAY (DRAPES) ×2 IMPLANT
DRAPE LAPAROSCOPIC ABDOMINAL (DRAPES) ×2 IMPLANT
DRAPE UTILITY XL STRL (DRAPES) ×2 IMPLANT
ELECT REM PT RETURN 9FT ADLT (ELECTROSURGICAL) ×2
ELECTRODE REM PT RTRN 9FT ADLT (ELECTROSURGICAL) ×1 IMPLANT
GAUZE SPONGE 2X2 8PLY STRL LF (GAUZE/BANDAGES/DRESSINGS) ×1 IMPLANT
GLOVE BIOGEL PI IND STRL 7.0 (GLOVE) ×3 IMPLANT
GLOVE BIOGEL PI INDICATOR 7.0 (GLOVE) ×3
GLOVE SURG ORTHO 8.0 STRL STRW (GLOVE) ×2 IMPLANT
GLOVE SURG SS PI 6.5 STRL IVOR (GLOVE) ×4 IMPLANT
GOWN STRL REUS W/TWL XL LVL3 (GOWN DISPOSABLE) ×10 IMPLANT
HEMOSTAT SURGICEL 4X8 (HEMOSTASIS) IMPLANT
KIT BASIN OR (CUSTOM PROCEDURE TRAY) ×2 IMPLANT
LIQUID BAND (GAUZE/BANDAGES/DRESSINGS) ×2 IMPLANT
NS IRRIG 1000ML POUR BTL (IV SOLUTION) ×2 IMPLANT
POUCH SPECIMEN RETRIEVAL 10MM (ENDOMECHANICALS) ×2 IMPLANT
SCISSORS LAP 5X35 DISP (ENDOMECHANICALS) ×2 IMPLANT
SET CHOLANGIOGRAPH MIX (MISCELLANEOUS) ×2 IMPLANT
SET IRRIG TUBING LAPAROSCOPIC (IRRIGATION / IRRIGATOR) ×2 IMPLANT
SLEEVE XCEL OPT CAN 5 100 (ENDOMECHANICALS) ×2 IMPLANT
SPONGE GAUZE 2X2 STER 10/PKG (GAUZE/BANDAGES/DRESSINGS) ×1
STRIP CLOSURE SKIN 1/2X4 (GAUZE/BANDAGES/DRESSINGS) ×2 IMPLANT
SUT MNCRL AB 4-0 PS2 18 (SUTURE) ×2 IMPLANT
TOWEL OR 17X26 10 PK STRL BLUE (TOWEL DISPOSABLE) ×2 IMPLANT
TOWEL OR NON WOVEN STRL DISP B (DISPOSABLE) ×2 IMPLANT
TRAY LAPAROSCOPIC (CUSTOM PROCEDURE TRAY) ×2 IMPLANT
TROCAR BLADELESS OPT 5 100 (ENDOMECHANICALS) ×2 IMPLANT
TROCAR XCEL BLUNT TIP 100MML (ENDOMECHANICALS) ×2 IMPLANT
TROCAR XCEL NON-BLD 11X100MML (ENDOMECHANICALS) ×2 IMPLANT

## 2014-04-14 NOTE — Op Note (Signed)
Procedure Note  Pre-operative Diagnosis:  Symptomatic cholelithiasis, chronic cholecystitis  Post-operative Diagnosis:  same  Surgeon:  Earnstine Regal, MD, FACS  Assistant:  none   Procedure:  Laparoscopic cholecystectomy with intra-operative cholangiography  Anesthesia:  General  Estimated Blood Loss:  minimal  Drains: none         Specimen: Gallbladder to pathology  Indications:  Patient is a 54 yo female with symptomatic cholelithiasis, diabetes.  Procedure Details:  The patient was seen in the pre-op holding area. The risks, benefits, complications, treatment options, and expected outcomes were previously discussed with the patient. The patient agreed with the proposed plan and has signed the informed consent form.  The patient was brought to the Operating Room, identified as Vanessa Cole and the procedure verified as laparoscopic cholecystectomy with intraoperative cholangiography. A "time out" was completed and the above information confirmed.  The patient was placed in the supine position. Following induction of general anesthesia, the abdomen was prepped and draped in the usual aseptic fashion.  An incision was made in the skin near the umbilicus. The midline fascia was incised and the peritoneal cavity was entered and a Hasson canula was introduced under direct vision.  The Hasson canula was secured with a 0-Vicryl pursestring suture. Pneumoperitoneum was established with carbon dioxide. Additional trocars were introduced under direct vision along the right costal margin in the midline, mid-clavicular line, and anterior axillary line.   The gallbladder was identified and the fundus grasped and retracted cephalad. Adhesions were taken down bluntly and the electrocautery was utilized as needed, taking care not to injure any adjacent structures. The infundibulum was grasped and retracted laterally, exposing the peritoneum overlying the triangle of Calot. The peritoneum was  incised and structures exposed with blunt dissection. The cystic duct was clearly identified, bluntly dissected circumferentially, and clipped at the neck of the gallbladder.  An incision was made in the cystic duct and the cholangiogram catheter introduced. The catheter was secured using an ligaclip.  Real-time cholangiography was performed using C-arm fluoroscopy.  There was rapid filling of a normal caliber common bile duct.  There was reflux of contrast into the left and right hepatic ductal systems.  There was free flow distally into the duodenum without filling defect or obstruction.  The catheter was removed from the peritoneal cavity.  The cystic duct was then ligated with surgical clips and divided. The cystic artery was identified, dissected circumferentially, ligated with ligaclips, and divided.  The gallbladder was dissected away from the liver bed using the electrocautery for hemostasis. The gallbladder was completely removed from the liver and placed into an endocatch bag. The gallbladder was removed in the endocatch bag through the umbilical port site and submitted to pathology for review.  The right upper quadrant was irrigated and the gallbladder bed was inspected. Hemostasis was achieved with the electrocautery.  Pneumoperitoneum was released after viewing removal of the trocars with good hemostasis noted. The umbilical wound was irrigated and the fascia was then closed with the pursestring suture.  Local anesthetic was infiltrated at all port sites. The skin incisions were closed with 4-0 Monocril subcuticular sutures and Dermabond was applied.  Instrument, sponge, and needle counts were correct at the conclusion of the case.  The patient was awakened from anesthesia and brought to the recovery room in stable condition.  The patient tolerated the procedure well.   Earnstine Regal, MD, [facility] Office: [phone]

## 2014-04-14 NOTE — Anesthesia Preprocedure Evaluation (Addendum)
Anesthesia Evaluation  Patient identified by MRN, date of birth, ID band Patient awake    Reviewed: Allergy & Precautions, NPO status , Patient's Chart, lab work & pertinent test results  Airway Mallampati: II  TM Distance: >3 FB Neck ROM: Full    Dental no notable dental hx.    Pulmonary neg pulmonary ROS,  breath sounds clear to auscultation  Pulmonary exam normal       Cardiovascular negative cardio ROS  Rhythm:Regular Rate:Normal     Neuro/Psych negative neurological ROS  negative psych ROS   GI/Hepatic negative GI ROS, Neg liver ROS,   Endo/Other  diabetes, Type 2, Insulin Dependent, Oral Hypoglycemic Agents  Renal/GU negative Renal ROS  negative genitourinary   Musculoskeletal negative musculoskeletal ROS (+) Fibromyalgia -  Abdominal   Peds negative pediatric ROS (+)  Hematology negative hematology ROS (+)   Anesthesia Other Findings   Reproductive/Obstetrics negative OB ROS                            Anesthesia Physical Anesthesia Plan  ASA: II  Anesthesia Plan: General   Post-op Pain Management:    Induction: Intravenous  Airway Management Planned: Oral ETT  Additional Equipment:   Intra-op Plan:   Post-operative Plan: Extubation in OR  Informed Consent: I have reviewed the patients History and Physical, chart, labs and discussed the procedure including the risks, benefits and alternatives for the proposed anesthesia with the patient or authorized representative who has indicated his/her understanding and acceptance.   Dental advisory given  Plan Discussed with: CRNA  Anesthesia Plan Comments:         Anesthesia Quick Evaluation

## 2014-04-14 NOTE — Transfer of Care (Signed)
Immediate Anesthesia Transfer of Care Note  Patient: Vanessa Cole  Procedure(s) Performed: Procedure(s) (LRB): LAPAROSCOPIC CHOLECYSTECTOMY WITH INTRAOPERATIVE CHOLANGIOGRAM (N/A)  Patient Location: PACU  Anesthesia Type: General  Level of Consciousness: sedated, patient cooperative and responds to stimulation  Airway & Oxygen Therapy: Patient Spontanous Breathing and Patient connected to face mask oxgen  Post-op Assessment: Report given to PACU RN and Post -op Vital signs reviewed and stable  Post vital signs: Reviewed and stable  Complications: No apparent anesthesia complications

## 2014-04-14 NOTE — Interval H&P Note (Signed)
History and Physical Interval Note:  04/14/2014 7:33 AM  Vanessa Cole  has presented today for surgery, with the diagnosis of chronic cholecystitis, cholelithiasis.  The various methods of treatment have been discussed with the patient and family. After consideration of risks, benefits and other options for treatment, the patient has consented to    Procedure(s): LAPAROSCOPIC CHOLECYSTECTOMY WITH INTRAOPERATIVE CHOLANGIOGRAM (N/A) as a surgical intervention .    The patient's history has been reviewed, patient examined, no change in status, stable for surgery.  I have reviewed the patient's chart and labs.  Questions were answered to the patient's satisfaction.    Earnstine Regal, MD, [facility] Office: [place]

## 2014-04-14 NOTE — Anesthesia Procedure Notes (Signed)
Procedure Name: Intubation Date/Time: 04/14/2014 7:42 AM Performed by: Maxwell Caul Pre-anesthesia Checklist: Patient identified, Emergency Drugs available, Suction available and Patient being monitored Patient Re-evaluated:Patient Re-evaluated prior to inductionOxygen Delivery Method: Circle system utilized Preoxygenation: Pre-oxygenation with 100% oxygen Intubation Type: IV induction Ventilation: Mask ventilation without difficulty Laryngoscope Size: Mac and 4 Grade View: Grade I Tube type: Oral Tube size: 7.0 mm Number of attempts: 1 Placement Confirmation: ETT inserted through vocal cords under direct vision Secured at: 21 cm Tube secured with: Tape Dental Injury: Teeth and Oropharynx as per pre-operative assessment

## 2014-04-14 NOTE — Anesthesia Postprocedure Evaluation (Signed)
  Anesthesia Post-op Note  Patient: Vanessa Cole  Procedure(s) Performed: Procedure(s) (LRB): LAPAROSCOPIC CHOLECYSTECTOMY WITH INTRAOPERATIVE CHOLANGIOGRAM (N/A)  Patient Location: PACU  Anesthesia Type: General  Level of Consciousness: awake and alert   Airway and Oxygen Therapy: Patient Spontanous Breathing  Post-op Pain: mild  Post-op Assessment: Post-op Vital signs reviewed, Patient's Cardiovascular Status Stable, Respiratory Function Stable, Patent Airway and No signs of Nausea or vomiting  Last Vitals:  Filed Vitals:   04/14/14 1330  BP: 133/78  Pulse: 109  Temp: 36.6 C  Resp: 16    Post-op Vital Signs: stable   Complications: No apparent anesthesia complications

## 2014-04-15 ENCOUNTER — Encounter (HOSPITAL_COMMUNITY): Payer: Self-pay | Admitting: Surgery

## 2014-04-15 DIAGNOSIS — K8010 Calculus of gallbladder with chronic cholecystitis without obstruction: Secondary | ICD-10-CM | POA: Diagnosis not present

## 2014-04-15 LAB — GLUCOSE, CAPILLARY: Glucose-Capillary: 216 mg/dL — ABNORMAL HIGH (ref 70–99)

## 2014-04-15 MED ORDER — INSULIN GLARGINE 100 UNIT/ML ~~LOC~~ SOLN: 24.0000 [IU] | Freq: Every day | SUBCUTANEOUS | Status: DC

## 2014-04-15 MED ORDER — INSULIN GLARGINE 100 UNIT/ML ~~LOC~~ SOLN
24.0000 [IU] | Freq: Every day | SUBCUTANEOUS | Status: DC
  Administered 2014-04-15: 24 [IU] via SUBCUTANEOUS
  Filled 2014-04-15: qty 0.24

## 2014-04-15 MED ORDER — HYDROCODONE-ACETAMINOPHEN 5-325 MG PO TABS: 1.0000 | ORAL_TABLET | ORAL | Status: DC | PRN

## 2014-04-15 NOTE — Discharge Summary (Signed)
Physician Discharge Summary [facility]  Patient ID: Vanessa Cole MRN: [id number] DOB/AGE: [birth year] 54 y.o.  Admit date: 04/14/2014 Discharge date: 04/15/2014  Admission Diagnoses:  Symptomatic cholelithiasis  Discharge Diagnoses:  Principal Problem:   Cholelithiasis with cholecystitis Active Problems:   Cholelithiasis with chronic cholecystitis   Discharged Condition: good  Hospital Course: Patient was admitted for observation following gallbladder surgery.  Post op course was uncomplicated.  Pain was well controlled.  Tolerated diet.  Patient was prepared for discharge home on POD#1.  Consults: None  Treatments: surgery: lap chole with IOC  Discharge Exam: Blood pressure 112/70, pulse 90, temperature 98 F (36.7 C), temperature source Oral, resp. rate 16, height 5\' 1"  (1.549 m), weight 158 lb 0.6 oz (71.684 kg), SpO2 97 %. HEENT - clear Neck - soft Chest - clear bilaterally Cor - RRR Abd - soft without distension; wounds dry and intact  Disposition: Home  Discharge Instructions    Diet - low sodium heart healthy    Complete by:  As directed      Discharge instructions    Complete by:  As directed   [facility]  LAPAROSCOPIC SURGERY - POST-OP INSTRUCTIONS  Always review your discharge instruction sheet given to you by the facility where your surgery was performed.  A prescription for pain medication may be given to you upon discharge.  Take your pain medication as prescribed.  If narcotic pain medicine is not needed, then you may take acetaminophen (Tylenol) or ibuprofen (Advil) as needed.  Take your usually prescribed medications unless otherwise directed.  If you need a refill on your pain medication, please contact your pharmacy.  They will contact our office to request authorization. Prescriptions will not be filled after 5 P.M. or on weekends.  You should follow a light diet the first few days after arrival home, such  as soup and crackers or toast.  Be sure to include plenty of fluids daily.  Most patients will experience some swelling and bruising in the area of the incisions.  Ice packs will help.  Swelling and bruising can take several days to resolve.   It is common to experience some constipation if taking pain medication after surgery.  Increasing fluid intake and taking a stool softener (such as Colace) will usually help or prevent this problem from occurring.  A mild laxative (Milk of Magnesia or Miralax) should be taken according to package instructions if there are no bowel movements after 48 hours.  Unless discharge instructions indicate otherwise, you may remove your bandages 24-48 hours after surgery, and you may shower at that time.  You may have steri-strips (small skin tapes) in place directly over the incision.  These strips should be left on the skin for 7-10 days.  If your surgeon used skin glue on the incision, you may shower in 24 hours.  The glue will flake off over the next 2-3 weeks.  Any sutures or staples will be removed at the office during your follow-up visit.  ACTIVITIES:  You may resume regular (light) daily activities beginning the next day-such as daily self-care, walking, climbing stairs-gradually increasing activities as tolerated.  You may have sexual intercourse when it is comfortable.  Refrain from any heavy lifting or straining until approved by your doctor.  You may drive when you are no longer taking prescription pain medication, you can comfortably wear a seatbelt, and you can safely maneuver your car and apply brakes.  You should see your doctor  in the office for a follow-up appointment approximately 2-3 weeks after your surgery.  Make sure that you call for this appointment within a day or two after you arrive home to insure a convenient appointment time.  WHEN TO CALL YOUR DOCTOR: Fever over 101.0 Inability to urinate Continued bleeding from incision Increased pain,  redness, or drainage from the incision Increasing abdominal pain  The clinic staff is available to answer your questions during regular business hours.  Please don't hesitate to call and ask to speak to one of the nurses for clinical concerns.  If you have a medical emergency, go to the nearest emergency room or call 911.  A surgeon from [facility] is always on call for the hospital.  Earnstine Regal, MD, [facility] Office: [place] Free:  [phone] FAX [phone]  Web site: [email or web address]     Increase activity slowly    Complete by:  As directed      No dressing needed    Complete by:  As directed             Medication List    TAKE these medications        aspirin 81 MG tablet  Take 81 mg by mouth daily at 12 noon.     doxycycline 100 MG tablet  Commonly known as:  VIBRA-TABS  Take 100 mg by mouth 2 (two) times daily. For rosacia-- @12  noon & @ 1700     HYDROcodone-acetaminophen 5-325 MG per tablet  Commonly known as:  NORCO/VICODIN  Take 1-2 tablets by mouth every 4 (four) hours as needed for moderate pain.     insulin glargine 100 UNIT/ML injection  Commonly known as:  LANTUS  Inject 24 Units into the skin at bedtime.     metFORMIN 500 MG tablet  Commonly known as:  GLUCOPHAGE  Take 500 mg by mouth 2 (two) times daily with a meal. @12  noon & @ 1700     pravastatin 20 MG tablet  Commonly known as:  PRAVACHOL  Take 20 mg by mouth every evening.         Earnstine Regal, MD, [facility] Office: [phone]   Signed: Earnstine Regal 04/15/2014, 7:39 AM

## 2014-07-20 ENCOUNTER — Other Ambulatory Visit: Payer: Self-pay | Admitting: Dermatology

## 2014-11-18 ENCOUNTER — Other Ambulatory Visit: Payer: Self-pay

## 2014-11-18 DIAGNOSIS — Z1231 Encounter for screening mammogram for malignant neoplasm of breast: Secondary | ICD-10-CM

## 2014-12-19 ENCOUNTER — Ambulatory Visit
Admission: RE | Admit: 2014-12-19 | Discharge: 2014-12-19 | Disposition: A | Discharge status: Home / Self Care | Payer: 59 | Source: Ambulatory Visit

## 2014-12-19 DIAGNOSIS — Z1231 Encounter for screening mammogram for malignant neoplasm of breast: Secondary | ICD-10-CM

## 2015-06-10 ENCOUNTER — Emergency Department (HOSPITAL_COMMUNITY)
Admission: EM | Admit: 2015-06-10 | Discharge: 2015-06-10 | Disposition: A | Discharge status: Home / Self Care | Payer: BLUE CROSS/BLUE SHIELD | Attending: Emergency Medicine | Admitting: Emergency Medicine

## 2015-06-10 ENCOUNTER — Emergency Department (HOSPITAL_COMMUNITY): Payer: BLUE CROSS/BLUE SHIELD

## 2015-06-10 ENCOUNTER — Encounter (HOSPITAL_COMMUNITY): Payer: Self-pay | Admitting: Emergency Medicine

## 2015-06-10 DIAGNOSIS — Z7984 Long term (current) use of oral hypoglycemic drugs: Secondary | ICD-10-CM | POA: Diagnosis not present

## 2015-06-10 DIAGNOSIS — R0789 Other chest pain: Secondary | ICD-10-CM | POA: Diagnosis not present

## 2015-06-10 DIAGNOSIS — Z872 Personal history of diseases of the skin and subcutaneous tissue: Secondary | ICD-10-CM | POA: Diagnosis not present

## 2015-06-10 DIAGNOSIS — R011 Cardiac murmur, unspecified: Secondary | ICD-10-CM | POA: Insufficient documentation

## 2015-06-10 DIAGNOSIS — Z794 Long term (current) use of insulin: Secondary | ICD-10-CM | POA: Diagnosis not present

## 2015-06-10 DIAGNOSIS — Z8719 Personal history of other diseases of the digestive system: Secondary | ICD-10-CM | POA: Insufficient documentation

## 2015-06-10 DIAGNOSIS — J189 Pneumonia, unspecified organism: Secondary | ICD-10-CM

## 2015-06-10 DIAGNOSIS — Z7982 Long term (current) use of aspirin: Secondary | ICD-10-CM | POA: Insufficient documentation

## 2015-06-10 DIAGNOSIS — E119 Type 2 diabetes mellitus without complications: Secondary | ICD-10-CM | POA: Insufficient documentation

## 2015-06-10 DIAGNOSIS — E785 Hyperlipidemia, unspecified: Secondary | ICD-10-CM | POA: Diagnosis not present

## 2015-06-10 DIAGNOSIS — R000 Tachycardia, unspecified: Secondary | ICD-10-CM | POA: Diagnosis not present

## 2015-06-10 DIAGNOSIS — R110 Nausea: Secondary | ICD-10-CM | POA: Diagnosis not present

## 2015-06-10 DIAGNOSIS — R61 Generalized hyperhidrosis: Secondary | ICD-10-CM | POA: Diagnosis not present

## 2015-06-10 DIAGNOSIS — J159 Unspecified bacterial pneumonia: Secondary | ICD-10-CM | POA: Insufficient documentation

## 2015-06-10 DIAGNOSIS — R0602 Shortness of breath: Secondary | ICD-10-CM | POA: Diagnosis not present

## 2015-06-10 DIAGNOSIS — Z79899 Other long term (current) drug therapy: Secondary | ICD-10-CM | POA: Diagnosis not present

## 2015-06-10 DIAGNOSIS — R079 Chest pain, unspecified: Secondary | ICD-10-CM | POA: Diagnosis not present

## 2015-06-10 LAB — BASIC METABOLIC PANEL
ANION GAP: 10 (ref 5–15)
BUN: 12 mg/dL (ref 6–20)
CALCIUM: 9.8 mg/dL (ref 8.9–10.3)
CO2: 25 mmol/L (ref 22–32)
CREATININE: 0.6 mg/dL (ref 0.44–1.00)
Chloride: 106 mmol/L (ref 101–111)
GFR calc non Af Amer: >60 mL/min (ref >60)
GLUCOSE: 169 mg/dL — AB (ref 65–99)
Potassium: 3.9 mmol/L (ref 3.5–5.1)
Sodium: 141 mmol/L (ref 135–145)

## 2015-06-10 LAB — CBC
HCT: 42.9 % (ref 36.0–46.0)
HEMOGLOBIN: 14 g/dL (ref 12.0–15.0)
MCH: 27.5 pg (ref 26.0–34.0)
MCHC: 32.6 g/dL (ref 30.0–36.0)
MCV: 84.3 fL (ref 78.0–100.0)
Platelets: 312 10*3/uL (ref 150–400)
RBC: 5.09 MIL/uL (ref 3.87–5.11)
RDW: 13.2 % (ref 11.5–15.5)
WBC: 7.6 10*3/uL (ref 4.0–10.5)

## 2015-06-10 LAB — I-STAT TROPONIN, ED
TROPONIN I, POC: 0 ng/mL (ref 0.00–0.08)
TROPONIN I, POC: 0 ng/mL (ref 0.00–0.08)

## 2015-06-10 MED ORDER — GI COCKTAIL ~~LOC~~
30.0000 mL | Freq: Once | ORAL | Status: AC
  Administered 2015-06-10: 30 mL via ORAL
  Filled 2015-06-10: qty 30

## 2015-06-10 MED ORDER — AZITHROMYCIN 250 MG PO TABS: 250.0000 mg | ORAL_TABLET | Freq: Every day | ORAL | Status: DC

## 2015-06-10 MED ORDER — PANTOPRAZOLE SODIUM 20 MG PO TBEC: 20.0000 mg | DELAYED_RELEASE_TABLET | Freq: Every day | ORAL | Status: DC

## 2015-06-10 MED ORDER — ALBUTEROL SULFATE HFA 108 (90 BASE) MCG/ACT IN AERS
2.0000 | INHALATION_SPRAY | Freq: Once | RESPIRATORY_TRACT | Status: AC
  Administered 2015-06-10: 2 via RESPIRATORY_TRACT
  Filled 2015-06-10: qty 6.7

## 2015-06-10 NOTE — Discharge Instructions (Signed)
Nonspecific Chest Pain  Chest pain can be caused by many different conditions. There is always a chance that your pain could be related to something serious, such as a heart attack or a blood clot in your lungs. Chest pain can also be caused by conditions that are not life-threatening. If you have chest pain, it is very important to follow up with your health care provider. CAUSES  Chest pain can be caused by:  Heartburn.  Pneumonia or bronchitis.  Anxiety or stress.  Inflammation around your heart (pericarditis) or lung (pleuritis or pleurisy).  A blood clot in your lung.  A collapsed lung (pneumothorax). It can develop suddenly on its own (spontaneous pneumothorax) or from trauma to the chest.  Shingles infection (varicella-zoster virus).  Heart attack.  Damage to the bones, muscles, and cartilage that make up your chest wall. This can include:  Bruised bones due to injury.  Strained muscles or cartilage due to frequent or repeated coughing or overwork.  Fracture to one or more ribs.  Sore cartilage due to inflammation (costochondritis). RISK FACTORS  Risk factors for chest pain may include:  Activities that increase your risk for trauma or injury to your chest.  Respiratory infections or conditions that cause frequent coughing.  Medical conditions or overeating that can cause heartburn.  Heart disease or family history of heart disease.  Conditions or health behaviors that increase your risk of developing a blood clot.  Having had chicken pox (varicella zoster). SIGNS AND SYMPTOMS Chest pain can feel like:  Burning or tingling on the surface of your chest or deep in your chest.  Crushing, pressure, aching, or squeezing pain.  Dull or sharp pain that is worse when you move, cough, or take a deep breath.  Pain that is also felt in your back, neck, shoulder, or arm, or pain that spreads to any of these areas. Your chest pain may come and go, or it may stay  constant. DIAGNOSIS Lab tests or other studies may be needed to find the cause of your pain. Your health care provider may have you take a test called an ambulatory ECG (electrocardiogram). An ECG records your heartbeat patterns at the time the test is performed. You may also have other tests, such as:  Transthoracic echocardiogram (TTE). During echocardiography, sound waves are used to create a picture of all of the heart structures and to look at how blood flows through your heart.  Transesophageal echocardiogram (TEE).This is a more advanced imaging test that obtains images from inside your body. It allows your health care provider to see your heart in finer detail.  Cardiac monitoring. This allows your health care provider to monitor your heart rate and rhythm in real time.  Holter monitor. This is a portable device that records your heartbeat and can help to diagnose abnormal heartbeats. It allows your health care provider to track your heart activity for several days, if needed.  Stress tests. These can be done through exercise or by taking medicine that makes your heart beat more quickly.  Blood tests.  Imaging tests. TREATMENT  Your treatment depends on what is causing your chest pain. Treatment may include:  Medicines. These may include:  Acid blockers for heartburn.  Anti-inflammatory medicine.  Pain medicine for inflammatory conditions.  Antibiotic medicine, if an infection is present.  Medicines to dissolve blood clots.  Medicines to treat coronary artery disease.  Supportive care for conditions that do not require medicines. This may include:  Resting.  Applying heat  or cold packs to injured areas.  Limiting activities until pain decreases. HOME CARE INSTRUCTIONS  If you were prescribed an antibiotic medicine, finish it all even if you start to feel better.  Avoid any activities that bring on chest pain.  Do not use any tobacco products, including  cigarettes, chewing tobacco, or electronic cigarettes. If you need help quitting, ask your health care provider.  Do not drink alcohol.  Take medicines only as directed by your health care provider.  Keep all follow-up visits as directed by your health care provider. This is important. This includes any further testing if your chest pain does not go away.  If heartburn is the cause for your chest pain, you may be told to keep your head raised (elevated) while sleeping. This reduces the chance that acid will go from your stomach into your esophagus.  Make lifestyle changes as directed by your health care provider. These may include:  Getting regular exercise. Ask your health care provider to suggest some activities that are safe for you.  Eating a heart-healthy diet. A registered dietitian can help you to learn healthy eating options.  Maintaining a healthy weight.  Managing diabetes, if necessary.  Reducing stress. SEEK MEDICAL CARE IF:  Your chest pain does not go away after treatment.  You have a rash with blisters on your chest.  You have a fever. SEEK IMMEDIATE MEDICAL CARE IF:   Your chest pain is worse.  You have an increasing cough, or you cough up blood.  You have severe abdominal pain.  You have severe weakness.  You faint.  You have chills.  You have sudden, unexplained chest discomfort.  You have sudden, unexplained discomfort in your arms, back, neck, or jaw.  You have shortness of breath at any time.  You suddenly start to sweat, or your skin gets clammy.  You feel nauseous or you vomit.  You suddenly feel light-headed or dizzy.  Your heart begins to beat quickly, or it feels like it is skipping beats. These symptoms may represent a serious problem that is an emergency. Do not wait to see if the symptoms will go away. Get medical help right away. Call your local emergency services (911 in the [place] Do not drive yourself to the hospital.   This  information is not intended to replace advice given to you by your health care provider. Make sure you discuss any questions you have with your health care provider.   Document Released: 12/05/2004 Document Revised: 03/18/2014 Document Reviewed: 10/01/2013 Elsevier Interactive Patient Education 2016 [employer]   Esophagitis Esophagitis is inflammation of the esophagus. The esophagus is the tube that carries food and liquids from your mouth to your stomach. Esophagitis can cause soreness or pain in the esophagus. This condition can make it difficult and painful to swallow.  CAUSES Most causes of esophagitis are not serious. Common causes of this condition include:  Gastroesophageal reflux disease (GERD). This is when stomach contents move back up into the esophagus (reflux).  Repeated vomiting.  An allergic-type reaction, especially caused by food allergies (eosinophilic esophagitis).  Injury to the esophagus by swallowing large pills with or without water, or swallowing certain types of medicines.  Swallowing (ingesting) harmful chemicals, such as household cleaning products.  Heavy alcohol use.  An infection of the esophagus.This most often occurs in people who have a weakened immune system.  Radiation or chemotherapy treatment for cancer.  Certain diseases such as sarcoidosis, Crohn disease, and scleroderma. SYMPTOMS Symptoms of  this condition include:  Difficult or painful swallowing.  Pain with swallowing acidic liquids, such as citrus juices.  Pain with burping.  Chest pain.  Difficulty breathing.  Nausea.  Vomiting.  Pain in the abdomen.  Weight loss.  Ulcers in the mouth.  Patches of white material in the mouth (candidiasis).  Fever.  Coughing up blood or vomiting blood.  Stool that is black, tarry, or bright red. DIAGNOSIS Your health care provider will take a medical history and perform a physical exam. You may also have other tests,  including:  An endoscopy to examine your stomach and esophagus with a small camera.  A test that measures the acidity level in your esophagus.  A test that measures how much pressure is on your esophagus.  A barium swallow or modified barium swallow to show the shape, size, and functioning of your esophagus.  Allergy tests. TREATMENT Treatment for this condition depends on the cause of your esophagitis. In some cases, steroids or other medicines may be given to help relieve your symptoms or to treat the underlying cause of your condition. You may have to make some lifestyle changes, such as:  Avoiding alcohol.  Quitting smoking.  Changing your diet.  Exercising.  Changing your sleep habits and your sleep environment. HOME CARE INSTRUCTIONS Take these actions to decrease your discomfort and to help avoid complications. Diet  Follow a diet as recommended by your health care provider. This may involve avoiding foods and drinks such as:  Coffee and tea (with or without caffeine).  Drinks that contain alcohol.  Energy drinks and sports drinks.  Carbonated drinks or sodas.  Chocolate and cocoa.  Peppermint and mint flavorings.  Garlic and onions.  Horseradish.  Spicy and acidic foods, including peppers, chili powder, curry powder, vinegar, hot sauces, and barbecue sauce.  Citrus fruit juices and citrus fruits, such as oranges, lemons, and limes.  Tomato-based foods, such as red sauce, chili, salsa, and pizza with red sauce.  Fried and fatty foods, such as donuts, french fries, potato chips, and high-fat dressings.  High-fat meats, such as hot dogs and fatty cuts of red and white meats, such as rib eye steak, sausage, ham, and bacon.  High-fat dairy items, such as whole milk, butter, and cream cheese.  Eat small, frequent meals instead of large meals.  Avoid drinking large amounts of liquid with your meals.  Avoid eating meals during the 2-3 hours before  bedtime.  Avoid lying down right after you eat.  Do not exercise right after you eat.  Avoid foods and drinks that seem to make your symptoms worse. General Instructions  Pay attention to any changes in your symptoms.  Take over-the-counter and prescription medicines only as told by your health care provider. Do not take aspirin, ibuprofen, or other NSAIDs unless your health care provider told you to do so.  If you have trouble taking pills, use a pill splitter to decrease the size of the pill. This will decrease the chance of the pill getting stuck or injuring your esophagus on the way down. Also, drink water after you take a pill.  Do not use any tobacco products, including cigarettes, chewing tobacco, and e-cigarettes. If you need help quitting, ask your health care provider.  Wear loose-fitting clothing. Do not wear anything tight around your waist that causes pressure on your abdomen.  Raise (elevate) the head of your bed about 6 inches (15 cm).  Try to reduce your stress, such as with yoga or meditation.  If you need help reducing stress, ask your health care provider.  If you are overweight, reduce your weight to an amount that is healthy for you. Ask your health care provider for guidance about a safe weight loss goal.  Keep all follow-up visits as told by your health care provider. This is important. SEEK MEDICAL CARE IF:  You have new symptoms.  You have unexplained weight loss.  You have difficulty swallowing, or it hurts to swallow.  You have wheezing or a persistent cough.  Your symptoms do not improve with treatment.  You have frequent heartburn for more than two weeks. SEEK IMMEDIATE MEDICAL CARE IF:  You have severe pain in your arms, neck, jaw, teeth, or back.  You feel sweaty, dizzy, or light-headed.  You have chest pain or shortness of breath.  You vomit and your vomit looks like blood or coffee grounds.  Your stool is bloody or black.  You have a  fever.  You cannot swallow, drink, or eat.   This information is not intended to replace advice given to you by your health care provider. Make sure you discuss any questions you have with your health care provider.   Document Released: 04/04/2004 Document Revised: 11/16/2014 Document Reviewed: 06/22/2014 Elsevier Interactive Patient Education 2016 [employer]  Nausea, Adult Nausea is the feeling that you have an upset stomach or have to vomit. Nausea by itself is not likely a serious concern, but it may be an early sign of more serious medical problems. As nausea gets worse, it can lead to vomiting. If vomiting develops, there is the risk of dehydration.  CAUSES   Viral infections.  Food poisoning.  Medicines.  Pregnancy.  Motion sickness.  Migraine headaches.  Emotional distress.  Severe pain from any source.  Alcohol intoxication. HOME CARE INSTRUCTIONS  Get plenty of rest.  Ask your caregiver about specific rehydration instructions.  Eat small amounts of food and sip liquids more often.  Take all medicines as told by your caregiver. SEEK MEDICAL CARE IF:  You have not improved after 2 days, or you get worse.  You have a headache. SEEK IMMEDIATE MEDICAL CARE IF:   You have a fever.  You faint.  You keep vomiting or have blood in your vomit.  You are extremely weak or dehydrated.  You have dark or bloody stools.  You have severe chest or abdominal pain. MAKE SURE YOU:  Understand these instructions.  Will watch your condition.  Will get help right away if you are not doing well or get worse.   This information is not intended to replace advice given to you by your health care provider. Make sure you discuss any questions you have with your health care provider.   Document Released: 04/04/2004 Document Revised: 03/18/2014 Document Reviewed: 11/07/2010 Elsevier Interactive Patient Education 2016 [employer] Pneumonia,  Adult Pneumonia is an infection of the lungs. There are different types of pneumonia. One type can develop while a person is in a hospital. A different type, called community-acquired pneumonia, develops in people who are not, or have not recently been, in the hospital or other health care facility.  CAUSES Pneumonia may be caused by bacteria, viruses, or funguses. Community-acquired pneumonia is often caused by Streptococcus pneumonia bacteria. These bacteria are often passed from one person to another by breathing in droplets from the cough or sneeze of an infected person. RISK FACTORS The condition is more likely to develop in:  People who havechronic diseases, such  as chronic obstructive pulmonary disease (COPD), asthma, congestive heart failure, cystic fibrosis, diabetes, or kidney disease.  People who haveearly-stage or late-stage HIV.  People who havesickle cell disease.  People who havehad their spleen removed (splenectomy).  People who havepoor Human resources officer.  People who havemedical conditions that increase the risk of breathing in (aspirating) secretions their own mouth and nose.   People who havea weakened immune system (immunocompromised).  People who smoke.  People whotravel to areas where pneumonia-causing germs commonly exist.  People whoare around animal habitats or animals that have pneumonia-causing germs, including birds, bats, rabbits, cats, and farm animals. SYMPTOMS Symptoms of this condition include:  Adry cough.  A wet (productive) cough.  Fever.  Sweating.  Chest pain, especially when breathing deeply or coughing.  Rapid breathing or difficulty breathing.  Shortness of breath.  Shaking chills.  Fatigue.  Muscle aches. DIAGNOSIS Your health care provider will take a medical history and perform a physical exam. You may also have other tests, including:  Imaging studies of your chest, including X-rays.  Tests to check your blood  oxygen level and other blood gases.  Other tests on blood, mucus (sputum), fluid around your lungs (pleural fluid), and urine. If your pneumonia is severe, other tests may be done to identify the specific cause of your illness. TREATMENT The type of treatment that you receive depends on many factors, such as the cause of your pneumonia, the medicines you take, and other medical conditions that you have. For most adults, treatment and recovery from pneumonia may occur at home. In some cases, treatment must happen in a hospital. Treatment may include:  Antibiotic medicines, if the pneumonia was caused by bacteria.  Antiviral medicines, if the pneumonia was caused by a virus.  Medicines that are given by mouth or through an IV tube.  Oxygen.  Respiratory therapy. Although rare, treating severe pneumonia may include:  Mechanical ventilation. This is done if you are not breathing well on your own and you cannot maintain a safe blood oxygen level.  Thoracentesis. This procedureremoves fluid around one lung or both lungs to help you breathe better. HOME CARE INSTRUCTIONS  Take over-the-counter and prescription medicines only as told by your health care provider.  Only takecough medicine if you are losing sleep. Understand that cough medicine can prevent your body's natural ability to remove mucus from your lungs.  If you were prescribed an antibiotic medicine, take it as told by your health care provider. Do not stop taking the antibiotic even if you start to feel better.  Sleep in a semi-upright position at night. Try sleeping in a reclining chair, or place a few pillows under your head.  Do not use tobacco products, including cigarettes, chewing tobacco, and e-cigarettes. If you need help quitting, ask your health care provider.  Drink enough water to keep your urine clear or pale yellow. This will help to thin out mucus secretions in your lungs. PREVENTION There are ways that you can  decrease your risk of developing community-acquired pneumonia. Consider getting a pneumococcal vaccine if:  You are older than 57 years of age.  You are older than 57 years of age and are undergoing cancer treatment, have chronic lung disease, or have other medical conditions that affect your immune system. Ask your health care provider if this applies to you. There are different types and schedules of pneumococcal vaccines. Ask your health care provider which vaccination option is best for you. You may also prevent community-acquired pneumonia if  you take these actions:  Get an influenza vaccine every year. Ask your health care provider which type of influenza vaccine is best for you.  Go to the dentist on a regular basis.  Wash your hands often. Use hand sanitizer if soap and water are not available. SEEK MEDICAL CARE IF:  You have a fever.  You are losing sleep because you cannot control your cough with cough medicine. SEEK IMMEDIATE MEDICAL CARE IF:  You have worsening shortness of breath.  You have increased chest pain.  Your sickness becomes worse, especially if you are an older adult or have a weakened immune system.  You cough up blood.   This information is not intended to replace advice given to you by your health care provider. Make sure you discuss any questions you have with your health care provider.   Document Released: 02/25/2005 Document Revised: 11/16/2014 Document Reviewed: 06/22/2014 Elsevier Interactive Patient Education [employer].

## 2015-06-10 NOTE — ED Notes (Signed)
Pt. reports intermittent left chest pain onset this week with mild SOB , denies nausea or diaphoresis , no cough or congestion .

## 2015-06-10 NOTE — ED Provider Notes (Signed)
CSN: [id number]     Arrival date & time 06/10/15  0137 History   First MD Initiated Contact with Patient 06/10/15 [phone]     Chief Complaint  Patient presents with  . Chest Pain     (Consider location/radiation/quality/duration/timing/severity/associated sxs/prior Treatment) Patient is a 55 y.o. female presenting with chest pain. The history is provided by the patient.  Chest Pain Pain location:  L chest Pain quality: sharp   Pain radiates to:  Does not radiate Pain radiates to the back: no   Pain severity now: 8/10. Onset quality:  Sudden Duration:  1 minute Timing:  Intermittent Progression:  Unchanged (more frequent) Chronicity:  New Context: at rest   Context: not breathing, no drug use, not eating, not lifting and no movement   Relieved by:  Nothing Worsened by:  Nothing tried Ineffective treatments:  Certain positions and rest Associated symptoms: diaphoresis and shortness of breath (takes her breath away when pain comes, no lasting SOB)   Associated symptoms: no abdominal pain, no anorexia, no anxiety, no back pain, no cough, no dizziness, no dysphagia, no fatigue, no fever, no headache, no heartburn, no lower extremity edema, no nausea, no near-syncope, no numbness, no orthopnea, no palpitations, no PND, no syncope, not vomiting and no weakness   Risk factors: diabetes mellitus and hypertension   Risk factors: no prior DVT/PE and no smoking      Pt is a 55 y/o female with pmhx of DM, HLD who presents to the ER with complaints of 3 days of intermittent left upper chest pain, described as sharp 8/10 pain, that lasts for approximately one minute with associated rapid heart rate that "takes her breath away."  The episodes have occurred a few times a day, but she became concerned with last night when going to bed, episodes became more frequently with associated diaphoresis.  She cannot identify anything that triggers the pain, she believes it occurs randomly.  She denies any temporal  relation of CP with exertion, eating, or position and she denies any worsening of pain with deep inspiration, exertion or position.  No alleviating factors.  She complains of a couple of days hot flashes and random sweats throughout the day.  She denies recent cough, uri sx, recent travel, le edema, orthopnea, PND, wheeze, GERD, anxiety.  Past Medical History  Diagnosis Date  . Diabetes mellitus without complication ([place])   . Hyperlipidemia   . Heart murmur   . Gallstones   . Fibromyalgia   . Rosacea    Past Surgical History  Procedure Laterality Date  . Appendectomy    . Nissen fundoplication    . Foot surgery    . Knee arthroscopy    . Ulcer surgery      same as NISSEN fundiplication  . Hernia repair    . Cholecystectomy N/A 04/14/2014    Procedure: LAPAROSCOPIC CHOLECYSTECTOMY WITH INTRAOPERATIVE CHOLANGIOGRAM;  Surgeon: [name], MD;  Location: [facility];  Service: General;  Laterality: N/A;   Family History  Problem Relation Age of Onset  . Diabetes     Social History  Substance Use Topics  . Smoking status: Never Smoker   . Smokeless tobacco: Never Used  . Alcohol Use: No   OB History    No data available     Review of Systems  Constitutional: Positive for diaphoresis. Negative for fever, activity change, appetite change and fatigue.  HENT: Negative for trouble swallowing.   Respiratory: Positive for shortness of breath (takes her breath away  when pain comes, no lasting SOB). Negative for cough.   Cardiovascular: Positive for chest pain. Negative for palpitations, orthopnea, syncope, PND and near-syncope.  Gastrointestinal: Negative for heartburn, nausea, vomiting, abdominal pain and anorexia.  Musculoskeletal: Negative for back pain.  Neurological: Negative for dizziness, weakness, numbness and headaches.  All other systems reviewed and are negative.     Allergies  Morphine and related  Home Medications   Prior to Admission medications   Medication Sig  Start Date End Date Taking? Authorizing Provider  aspirin 81 MG tablet Take 81 mg by mouth daily at 12 noon.    Yes Historical Provider, MD  doxycycline (VIBRA-TABS) 100 MG tablet Take 100 mg by mouth 2 (two) times daily. For rosacia-- @12  noon & @ 1700   Yes Historical Provider, MD  insulin glargine (LANTUS) 100 UNIT/ML injection Inject 28 Units into the skin every morning.    Yes Historical Provider, MD  metFORMIN (GLUCOPHAGE) 500 MG tablet Take 1,000 mg by mouth 2 (two) times daily with a meal. @12  noon & @ 1700 08/04/12  Yes Rosana Hoes, MD  pravastatin (PRAVACHOL) 20 MG tablet Take 20 mg by mouth every evening.   Yes Historical Provider, MD  azithromycin (ZITHROMAX) 250 MG tablet Take 1 tablet (250 mg total) by mouth daily. Take first 2 tablets together, then 1 every day until finished. 06/10/15   Delsa Grana, PA-C  pantoprazole (PROTONIX) 20 MG tablet Take 1 tablet (20 mg total) by mouth daily. 06/10/15   Delsa Grana, PA-C   BP 136/89 mmHg  Pulse 93  Temp(Src) 98.4 F (36.9 C) (Oral)  Resp 15  Ht 5\' 3"  (1.6 m)  Wt 74.39 kg  BMI 29.06 kg/m2  SpO2 94% Physical Exam  Constitutional: She is oriented to person, place, and time. She appears well-developed and well-nourished. No distress.  HENT:  Head: Normocephalic and atraumatic.  Nose: Nose normal.  Mouth/Throat: Oropharynx is clear and moist. No oropharyngeal exudate.  Eyes: Conjunctivae and EOM are normal. Pupils are equal, round, and reactive to light. Right eye exhibits no discharge. Left eye exhibits no discharge. No scleral icterus.  Neck: Normal range of motion. Neck supple. No JVD present. No tracheal deviation present. No thyromegaly present.  Cardiovascular: Normal rate, regular rhythm, normal heart sounds and intact distal pulses.  Exam reveals no gallop and no friction rub.   No murmur heard. No LE edema,   Pulmonary/Chest: Effort normal. No respiratory distress. She has no wheezes. She has rales. She exhibits tenderness.   Abdominal: Soft. Bowel sounds are normal. She exhibits no distension and no mass. There is no tenderness. There is no rebound and no guarding.  Musculoskeletal: Normal range of motion. She exhibits no edema or tenderness.  Lymphadenopathy:    She has no cervical adenopathy.  Neurological: She is alert and oriented to person, place, and time. She has normal reflexes. No cranial nerve deficit. She exhibits normal muscle tone. Coordination normal.  Skin: Skin is warm and dry. No rash noted. She is not diaphoretic. No erythema. No pallor.  Psychiatric: She has a normal mood and affect. Her behavior is normal. Judgment and thought content normal.  Nursing note and vitals reviewed.   ED Course  Procedures (including critical care time) Labs Review Labs Reviewed  BASIC METABOLIC PANEL - Abnormal; Notable for the following:    Glucose, Bld 169 (*)    All other components within normal limits  CBC  I-STAT TROPOININ, ED  Randolm Idol, ED    Imaging  Review Dg Chest 2 View  06/10/2015  CLINICAL DATA:  Acute onset of mid chest pain, tachycardia and shortness of breath. Initial encounter. EXAM: CHEST  2 VIEW COMPARISON:  Chest radiograph performed 04/11/2007 FINDINGS: The lungs are well-aerated. Mild left basilar opacity may reflect atelectasis or possibly mild pneumonia. There is no evidence of focal opacification, pleural effusion or pneumothorax. The heart is normal in size; the mediastinal contour is within normal limits. No acute osseous abnormalities are seen. Clips are noted within the right upper quadrant, reflecting prior cholecystectomy. IMPRESSION: Mild left basilar opacity may reflect atelectasis or possibly mild pneumonia. Electronically Signed   By: Garald Balding M.D.   On: 06/10/2015 02:36   I have personally reviewed and evaluated these images and lab results as part of my medical decision-making.   EKG Interpretation   Date/Time:  Saturday June 10 2015 01:43:06  EDT Ventricular Rate:  101 PR Interval:  148 QRS Duration: 76 QT Interval:  324 QTC Calculation: 420 R Axis:   51 Text Interpretation:  Sinus tachycardia Otherwise normal ECG Confirmed by  Christy Gentles  MD, DONALD ([id number]) on 06/10/2015 3:32:48 AM      MDM   Pt with intermittent left sided, sharp chest pain, coming and going without alleviating or aggrivating factors, specifically not worsened with exertion or positional changes.  CP work up initiated.  Pt has multiple risk factors for ACS, including obesity, HLD, DM, given presentation, heart score is 3. I feel that her presentation of CP is not likely ACS.  She has negative troponin x 2.  EKG is sinus tach, otherwise normal.  She was concerned about increasing frequency of CP episodes, however she has been in the ER for several hours and she has not had any recurrent episodes of CP.  At the time of my exam the pt had no chest pain, however it was reproducible with palpation of her left upper chest wall.  CXR was pertinent for mild left basilar opacity, which correlates with mild rales in lungs, however she denies recent cough or URI.  Pt may have CAP with intermittent CP, CXR findings, sweats and hot flashes.  I offered tx with Zpak to cover for possible CAP, which pt agreed to.  I also reviewed tele monitoring while she was in the ER, she did not have any capture of tachycardic episodes or arrhythmia while in the ER.  Feel pt is safe to d/c home and may need further w/up outpt, as she may benefit from other non-emergent testing.  She was encouraged to f/up with her PCP in 3-5 days for recheck.  Return precautions were reviewed and pt verbalized understanding.    Final diagnoses:  Atypical chest pain  Nausea  CAP (community acquired pneumonia)        Delsa Grana, PA-C 06/16/15 0217  Ripley Fraise, MD 06/16/15 1649

## 2015-06-10 NOTE — ED Notes (Signed)
Pt stable, ambulatory, states understanding of discharge instructions 

## 2015-06-14 DIAGNOSIS — J189 Pneumonia, unspecified organism: Secondary | ICD-10-CM | POA: Diagnosis not present

## 2015-06-14 DIAGNOSIS — Z6827 Body mass index (BMI) 27.0-27.9, adult: Secondary | ICD-10-CM | POA: Diagnosis not present

## 2015-06-14 DIAGNOSIS — R079 Chest pain, unspecified: Secondary | ICD-10-CM | POA: Diagnosis not present

## 2015-06-22 DIAGNOSIS — M79641 Pain in right hand: Secondary | ICD-10-CM | POA: Diagnosis not present

## 2015-06-22 DIAGNOSIS — M79642 Pain in left hand: Secondary | ICD-10-CM | POA: Diagnosis not present

## 2015-06-22 DIAGNOSIS — R229 Localized swelling, mass and lump, unspecified: Secondary | ICD-10-CM | POA: Diagnosis not present

## 2015-08-22 DIAGNOSIS — M859 Disorder of bone density and structure, unspecified: Secondary | ICD-10-CM | POA: Diagnosis not present

## 2015-08-22 DIAGNOSIS — E784 Other hyperlipidemia: Secondary | ICD-10-CM | POA: Diagnosis not present

## 2015-08-22 DIAGNOSIS — Z6827 Body mass index (BMI) 27.0-27.9, adult: Secondary | ICD-10-CM | POA: Diagnosis not present

## 2015-08-22 DIAGNOSIS — E1165 Type 2 diabetes mellitus with hyperglycemia: Secondary | ICD-10-CM | POA: Diagnosis not present

## 2015-11-23 DIAGNOSIS — Z6825 Body mass index (BMI) 25.0-25.9, adult: Secondary | ICD-10-CM | POA: Diagnosis not present

## 2015-11-23 DIAGNOSIS — E1165 Type 2 diabetes mellitus with hyperglycemia: Secondary | ICD-10-CM | POA: Diagnosis not present

## 2015-11-23 DIAGNOSIS — M25571 Pain in right ankle and joints of right foot: Secondary | ICD-10-CM | POA: Diagnosis not present

## 2015-11-27 ENCOUNTER — Ambulatory Visit (HOSPITAL_COMMUNITY)
Admission: RE | Admit: 2015-11-27 | Discharge: 2015-11-27 | Disposition: A | Discharge status: Home / Self Care | Payer: BLUE CROSS/BLUE SHIELD | Source: Ambulatory Visit | Attending: Internal Medicine | Admitting: Internal Medicine

## 2015-11-27 ENCOUNTER — Other Ambulatory Visit (HOSPITAL_COMMUNITY): Payer: Self-pay | Admitting: Internal Medicine

## 2015-11-27 DIAGNOSIS — M79604 Pain in right leg: Secondary | ICD-10-CM

## 2015-11-27 DIAGNOSIS — M7989 Other specified soft tissue disorders: Secondary | ICD-10-CM

## 2015-11-27 DIAGNOSIS — E1165 Type 2 diabetes mellitus with hyperglycemia: Secondary | ICD-10-CM | POA: Diagnosis not present

## 2015-11-27 DIAGNOSIS — E784 Other hyperlipidemia: Secondary | ICD-10-CM | POA: Diagnosis not present

## 2015-12-13 ENCOUNTER — Ambulatory Visit (INDEPENDENT_AMBULATORY_CARE_PROVIDER_SITE_OTHER): Payer: BLUE CROSS/BLUE SHIELD | Admitting: Podiatry

## 2015-12-13 ENCOUNTER — Encounter: Payer: Self-pay | Admitting: Podiatry

## 2015-12-13 VITALS — BP 123/74 | HR 77 | Resp 16 | Ht 62.0 in | Wt 160.0 lb

## 2015-12-13 DIAGNOSIS — L600 Ingrowing nail: Secondary | ICD-10-CM | POA: Diagnosis not present

## 2015-12-13 NOTE — Progress Notes (Signed)
   Subjective:    Patient ID: Vanessa Cole, female    DOB: January 20, 1961, 55 y.o.   MRN: [id number]  HPI Chief Complaint  Patient presents with  . Nail Problem    Left foot; great toe & 4th toe; nail discoloration & thickened nail; pt stated, "wants nails checked for nail fungus"; x6 months      Review of Systems  All other systems reviewed and are negative.      Objective:   Physical Exam        Assessment & Plan:

## 2015-12-14 NOTE — Progress Notes (Signed)
Subjective:     Patient ID: Vanessa Cole, female   DOB: 01-20-1961, 55 y.o.   MRN: [id number]  HPI patient presents with damaged left hallux nail fourth nail with incurvation of the bed pain when pressed mid dorsal direction and pain in the corners of the beds   Review of Systems  All other systems reviewed and are negative.      Objective:   Physical Exam  Constitutional: She is oriented to person, place, and time.  Cardiovascular: Intact distal pulses.   Musculoskeletal: Normal range of motion.  Neurological: She is oriented to person, place, and time.  Skin: Skin is warm.  Nursing note and vitals reviewed.  neurovascular status intact muscle strength adequate range of motion within normal limits with patient found to have damaged hallux nail left that's lifted with pain fourth nail left that's lifted with pain with underlying structural changes within the beds. Patient's found to have good digital perfusion and well oriented 3     Assessment:     Damage nailbeds with pain hallux and fourth left with underlying structural issues of the nailbeds    Plan:     H&P condition reviewed and recommended correction and removal of nails and patient wants this done but cannot do today. She is scheduled for this procedure to be performed in the next several weeks and was given all education concerning procedure

## 2015-12-25 DIAGNOSIS — M25561 Pain in right knee: Secondary | ICD-10-CM | POA: Diagnosis not present

## 2016-01-19 ENCOUNTER — Ambulatory Visit: Payer: BLUE CROSS/BLUE SHIELD | Admitting: Podiatry

## 2016-01-29 DIAGNOSIS — H2513 Age-related nuclear cataract, bilateral: Secondary | ICD-10-CM | POA: Diagnosis not present

## 2016-01-29 DIAGNOSIS — H35371 Puckering of macula, right eye: Secondary | ICD-10-CM | POA: Diagnosis not present

## 2016-01-29 DIAGNOSIS — E119 Type 2 diabetes mellitus without complications: Secondary | ICD-10-CM | POA: Diagnosis not present

## 2016-01-29 DIAGNOSIS — H52203 Unspecified astigmatism, bilateral: Secondary | ICD-10-CM | POA: Diagnosis not present

## 2016-02-26 DIAGNOSIS — E1165 Type 2 diabetes mellitus with hyperglycemia: Secondary | ICD-10-CM | POA: Diagnosis not present

## 2016-02-26 DIAGNOSIS — M25561 Pain in right knee: Secondary | ICD-10-CM | POA: Diagnosis not present

## 2016-02-26 DIAGNOSIS — Z6825 Body mass index (BMI) 25.0-25.9, adult: Secondary | ICD-10-CM | POA: Diagnosis not present

## 2016-02-28 ENCOUNTER — Other Ambulatory Visit: Payer: Self-pay | Admitting: Internal Medicine

## 2016-02-28 DIAGNOSIS — M25561 Pain in right knee: Secondary | ICD-10-CM

## 2016-03-12 ENCOUNTER — Encounter (INDEPENDENT_AMBULATORY_CARE_PROVIDER_SITE_OTHER): Payer: Self-pay

## 2016-03-12 ENCOUNTER — Encounter (INDEPENDENT_AMBULATORY_CARE_PROVIDER_SITE_OTHER): Payer: Self-pay | Admitting: Orthopaedic Surgery

## 2016-03-12 ENCOUNTER — Ambulatory Visit (INDEPENDENT_AMBULATORY_CARE_PROVIDER_SITE_OTHER): Payer: BLUE CROSS/BLUE SHIELD | Admitting: Orthopaedic Surgery

## 2016-03-12 ENCOUNTER — Ambulatory Visit (INDEPENDENT_AMBULATORY_CARE_PROVIDER_SITE_OTHER): Payer: Self-pay

## 2016-03-12 DIAGNOSIS — M25561 Pain in right knee: Secondary | ICD-10-CM

## 2016-03-12 NOTE — Progress Notes (Signed)
Office Visit Note   Patient: Vanessa Cole           Date of Birth: [birth year]           MRN: [id number] Visit Date: 03/12/2016              Requested by: [name], MD [place], [place] [address] PCP: [name], MD   Assessment & Plan: Visit Diagnoses:  1. Acute pain of right knee     Plan: Impression is degenerative medial meniscal tear. Intra-articular steroid injection was performed today patient tolerates well follow-up in 4 weeks for recheck. She does already have an MRI scheduled for tomorrow but I think we should just see how this injection does for her before doing an MRI.  Follow-Up Instructions: Return in about 4 weeks (around 04/09/2016) for recheck right knee.   Orders:  Orders Placed This Encounter  Procedures  . XR KNEE 3 VIEW RIGHT   No orders of the defined types were placed in this encounter.     Procedures: Large Joint Inj Date/Time: 03/12/2016 5:29 PM Performed by: Leandrew Koyanagi Authorized by: Leandrew Koyanagi   Consent Given by:  Patient Timeout: prior to procedure the correct patient, procedure, and site was verified   Indications:  Pain Location:  Knee Site:  R knee Prep: patient was prepped and draped in usual sterile fashion   Needle Size:  22 G Ultrasound Guidance: No   Fluoroscopic Guidance: No   Arthrogram: No   Patient tolerance:  Patient tolerated the procedure well with no immediate complications     Clinical Data: No additional findings.   Subjective: Chief Complaint  Patient presents with  . Right Knee - Follow-up    Patient is a 56 year old female with right knee pain since October 2017. The pain is localized on the medial aspect of the knee and is worse with sitting or lying down with occasional swelling. The pain does not radiate and denies mechanical symptoms Aleve does not help it. She has not had any injuries or injections.    Review of Systems Complete review of systems is negative except  for history of present illness  Objective: Vital Signs: There were no vitals taken for this visit.  Physical Exam Well-developed well-nourished no acute distress alert 3 nonlabored breathing normal judgments affect abdomen soft no lymphadenopathy Ortho Exam Exam of the right knee shows no joint effusion. She has normal range of motion. Collaterals and cruciates are stable. Medial joint line tenderness. Equivocal [place]. Specialty Comments:  No specialty comments available.  Imaging: Xr Knee 3 View Right  Result Date: 03/12/2016 No significant DJD    PMFS History: Patient Active Problem List   Diagnosis Date Noted  . Cholelithiasis with chronic cholecystitis 04/14/2014  . Cholelithiasis with cholecystitis 09/08/2012  . Metatarsalgia of right foot 08/19/2012  . Deformity of metatarsal 08/19/2012   Past Medical History:  Diagnosis Date  . Diabetes mellitus without complication ([place])   . Fibromyalgia   . Gallstones   . Heart murmur   . Hyperlipidemia   . Rosacea     Family History  Problem Relation Age of Onset  . Diabetes      Past Surgical History:  Procedure Laterality Date  . APPENDECTOMY    . CHOLECYSTECTOMY N/A 04/14/2014   Procedure: LAPAROSCOPIC CHOLECYSTECTOMY WITH INTRAOPERATIVE CHOLANGIOGRAM;  Surgeon: [name], MD;  Location: [facility];  Service: General;  Laterality: N/A;  . FOOT SURGERY    . HERNIA REPAIR    .  KNEE ARTHROSCOPY    . NISSEN FUNDOPLICATION    . 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 Smoking status: Never Smoker  . Smokeless tobacco: Never Used  . Alcohol use No  . Drug use: No  . Sexual activity: Not on file

## 2016-03-13 ENCOUNTER — Other Ambulatory Visit: Payer: BLUE CROSS/BLUE SHIELD

## 2016-04-09 ENCOUNTER — Ambulatory Visit (INDEPENDENT_AMBULATORY_CARE_PROVIDER_SITE_OTHER): Payer: BLUE CROSS/BLUE SHIELD | Admitting: Orthopaedic Surgery

## 2016-04-23 ENCOUNTER — Ambulatory Visit (INDEPENDENT_AMBULATORY_CARE_PROVIDER_SITE_OTHER): Payer: Self-pay

## 2016-04-23 ENCOUNTER — Encounter (INDEPENDENT_AMBULATORY_CARE_PROVIDER_SITE_OTHER): Payer: Self-pay | Admitting: Orthopaedic Surgery

## 2016-04-23 ENCOUNTER — Ambulatory Visit (INDEPENDENT_AMBULATORY_CARE_PROVIDER_SITE_OTHER): Payer: BLUE CROSS/BLUE SHIELD | Admitting: Orthopaedic Surgery

## 2016-04-23 DIAGNOSIS — M25562 Pain in left knee: Secondary | ICD-10-CM

## 2016-04-23 DIAGNOSIS — M25572 Pain in left ankle and joints of left foot: Secondary | ICD-10-CM

## 2016-04-23 MED ORDER — MELOXICAM 7.5 MG PO TABS: 7.5000 mg | ORAL_TABLET | Freq: Two times a day (BID) | ORAL | 2 refills | Status: AC | PRN

## 2016-04-23 NOTE — Progress Notes (Signed)
Office Visit Note   Patient: Vanessa Cole           Date of Birth: [birth year]           MRN: [id number] Visit Date: 04/23/2016              Requested by: [name], MD [place], [place] [address] PCP: [name], MD   Assessment & Plan: Visit Diagnoses:  1. Acute pain of left knee   2. 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 Were to treat her with 4 weeks of CAM boot and Modic. I will like to see her back in 4 weeks to see how she is doing  Follow-Up Instructions: No Follow-up on file.   Orders:  Orders Placed This Encounter  Procedures  . XR Knee 1-2 Views Left  . XR Ankle Complete Left   Meds ordered this encounter  Medications  . meloxicam (MOBIC) 7.5 MG tablet    Sig: Take 1 tablet (7.5 mg total) by mouth 2 (two) times daily as needed for pain.    Dispense:  30 tablet    Refill:  2      Procedures: No procedures performed   Clinical Data: No additional findings.   Subjective: Chief Complaint  Patient presents with  . Left Knee - Pain  . Left Ankle - Pain    Patient comes in today for a new problem of left knee pain and left ankle pain. Her right knee is doing well. Her left knee hurts on the medial side but is not bad enough to warrant an injection. Her left ankle has been hurting around the medial gutter region. She denies any injuries. It will occasionally catch and cause a sharp shooting pain. She's been taking Aleve. Denies any swelling.    Review of Systems Complete review of systems negative except for history of present illness  Objective: Vital Signs: There were no vitals taken for this visit.  Physical Exam  Constitutional: She is oriented to person, place, and time. She appears well-developed and well-nourished.  Pulmonary/Chest: Effort normal.  Neurological: She is alert and oriented to person, place, and time.  Skin: Skin is warm. Capillary refill  takes less than 2 seconds.  Psychiatric: She has a normal mood and affect. Her behavior is normal. Judgment and thought content normal.  Nursing note and vitals reviewed.   Ortho Exam  Exam of the left ankle shows no joint effusion. She has good range of motion. Tibialis anterior is nontender. She has full motor and sensory function. She does have medial gutter tenderness to palpation.   Exam left knee shows no joint effusion. She has mild joint line tenderness. No meniscal signs. Closed cruciate's are stable.  Specialty Comments:  No specialty comments available.  Imaging: No results found.   PMFS History: Patient Active Problem List   Diagnosis Date Noted  . Cholelithiasis with chronic cholecystitis 04/14/2014  . Cholelithiasis with cholecystitis 09/08/2012  . Metatarsalgia of right foot 08/19/2012  . Deformity of metatarsal 08/19/2012   Past Medical History:  Diagnosis Date  . Diabetes mellitus without complication ([place])   . Fibromyalgia   . Gallstones   . Heart murmur   . Hyperlipidemia   . Rosacea     Family History  Problem Relation Age of Onset  . Diabetes      Past Surgical History:  Procedure Laterality Date  .  APPENDECTOMY    . CHOLECYSTECTOMY N/A 04/14/2014   Procedure: LAPAROSCOPIC CHOLECYSTECTOMY WITH INTRAOPERATIVE CHOLANGIOGRAM;  Surgeon: [name], MD;  Location: [facility];  Service: General;  Laterality: N/A;  . FOOT SURGERY    . HERNIA REPAIR    . KNEE ARTHROSCOPY    . NISSEN FUNDOPLICATION    . 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 Smoking status: Never Smoker  . Smokeless tobacco: Never Used  . Alcohol use No  . Drug use: No  . Sexual activity: Not on file

## 2016-05-06 DIAGNOSIS — M859 Disorder of bone density and structure, unspecified: Secondary | ICD-10-CM | POA: Diagnosis not present

## 2016-05-06 DIAGNOSIS — E1165 Type 2 diabetes mellitus with hyperglycemia: Secondary | ICD-10-CM | POA: Diagnosis not present

## 2016-05-06 DIAGNOSIS — Z0000 Encounter for general adult medical examination without abnormal findings: Secondary | ICD-10-CM | POA: Diagnosis not present

## 2016-05-14 ENCOUNTER — Other Ambulatory Visit: Payer: Self-pay | Admitting: Internal Medicine

## 2016-05-14 DIAGNOSIS — Z1231 Encounter for screening mammogram for malignant neoplasm of breast: Secondary | ICD-10-CM

## 2016-05-17 DIAGNOSIS — F4321 Adjustment disorder with depressed mood: Secondary | ICD-10-CM | POA: Diagnosis not present

## 2016-05-17 DIAGNOSIS — Z1389 Encounter for screening for other disorder: Secondary | ICD-10-CM | POA: Diagnosis not present

## 2016-05-17 DIAGNOSIS — E1165 Type 2 diabetes mellitus with hyperglycemia: Secondary | ICD-10-CM | POA: Diagnosis not present

## 2016-05-17 DIAGNOSIS — M25561 Pain in right knee: Secondary | ICD-10-CM | POA: Diagnosis not present

## 2016-05-17 DIAGNOSIS — Z0000 Encounter for general adult medical examination without abnormal findings: Secondary | ICD-10-CM | POA: Diagnosis not present

## 2016-05-17 DIAGNOSIS — M859 Disorder of bone density and structure, unspecified: Secondary | ICD-10-CM | POA: Diagnosis not present

## 2016-05-23 DIAGNOSIS — Z1212 Encounter for screening for malignant neoplasm of rectum: Secondary | ICD-10-CM | POA: Diagnosis not present

## 2016-06-03 ENCOUNTER — Ambulatory Visit
Admission: RE | Admit: 2016-06-03 | Discharge: 2016-06-03 | Disposition: A | Discharge status: Home / Self Care | Payer: BLUE CROSS/BLUE SHIELD | Source: Ambulatory Visit | Attending: Internal Medicine | Admitting: Internal Medicine

## 2016-06-03 DIAGNOSIS — Z1231 Encounter for screening mammogram for malignant neoplasm of breast: Secondary | ICD-10-CM | POA: Diagnosis not present

## 2016-09-17 ENCOUNTER — Encounter (HOSPITAL_COMMUNITY): Payer: Self-pay | Admitting: *Deleted

## 2016-09-17 ENCOUNTER — Ambulatory Visit (HOSPITAL_COMMUNITY)
Admission: EM | Admit: 2016-09-17 | Discharge: 2016-09-17 | Disposition: A | Discharge status: Home / Self Care | Payer: BLUE CROSS/BLUE SHIELD | Attending: Family Medicine | Admitting: Family Medicine

## 2016-09-17 DIAGNOSIS — J069 Acute upper respiratory infection, unspecified: Secondary | ICD-10-CM

## 2016-09-17 DIAGNOSIS — B9789 Other viral agents as the cause of diseases classified elsewhere: Secondary | ICD-10-CM

## 2016-09-17 MED ORDER — FLUTICASONE PROPIONATE 50 MCG/ACT NA SUSP: 2.0000 | Freq: Every day | NASAL | 0 refills | Status: DC

## 2016-09-17 MED ORDER — HYDROCODONE-HOMATROPINE 5-1.5 MG/5ML PO SYRP: 5.0000 mL | ORAL_SOLUTION | Freq: Four times a day (QID) | ORAL | 0 refills | Status: DC | PRN

## 2016-09-17 MED ORDER — BENZONATATE 100 MG PO CAPS: 100.0000 mg | ORAL_CAPSULE | Freq: Three times a day (TID) | ORAL | 0 refills | Status: DC

## 2016-09-17 NOTE — ED Triage Notes (Signed)
Pt  Reports   Cough     With  sorethroat   And   Chest  Wall  Pain       Symptoms  Started   About  5  Days   Ago

## 2016-09-17 NOTE — ED Provider Notes (Signed)
[place]    CSN: [id number] Arrival date & time: 09/17/16  1326     History   Chief Complaint Chief Complaint  Patient presents with  . Cough    HPI Vanessa Cole is a 56 y.o. female with a history of IDDM, HLD, and fibromyalgia who prsents to urgent care for evaluation of cough.   She's had nonproductive cough of gradual onset starting 5 days ago which has worsened in severity and frequency constantly since that time. In the past 3 days she's noticed nasal congestion and suspected PND and left ear pressure with low grade fever yesterday. No chest pain, dyspnea. Taking tylenol with some improvement in malaise, but has tried nothing to suppress cough. CBGs have not been elevated. No recent sick contacts.    HPI  Past Medical History:  Diagnosis Date  . Diabetes mellitus without complication ([place])   . Fibromyalgia   . Gallstones   . Heart murmur   . Hyperlipidemia   . Rosacea     Patient Active Problem List   Diagnosis Date Noted  . Cholelithiasis with chronic cholecystitis 04/14/2014  . Cholelithiasis with cholecystitis 09/08/2012  . Metatarsalgia of right foot 08/19/2012  . Deformity of metatarsal 08/19/2012    Past Surgical History:  Procedure Laterality Date  . APPENDECTOMY    . CHOLECYSTECTOMY N/A 04/14/2014   Procedure: LAPAROSCOPIC CHOLECYSTECTOMY WITH INTRAOPERATIVE CHOLANGIOGRAM;  Surgeon: [name], MD;  Location: [facility];  Service: General;  Laterality: N/A;  . FOOT SURGERY    . HERNIA REPAIR    . KNEE ARTHROSCOPY    . NISSEN FUNDOPLICATION    . ulcer surgery     same as NISSEN fundiplication    OB History    No data available       Home Medications    Prior to Admission medications   Medication Sig Start Date End Date Taking? Authorizing Provider  aspirin 81 MG tablet Take 81 mg by mouth daily at 12 noon.     [provider]  benzonatate (TESSALON) 100 MG capsule Take 1 capsule (100 mg total) by mouth every 8 (eight)  hours. 09/17/16   Patrecia Pour, MD  doxycycline (VIBRA-TABS) 100 MG tablet Take 100 mg by mouth 2 (two) times daily. For rosacia-- @12  noon & @ 1700    [provider]  fluticasone (FLONASE) 50 MCG/ACT nasal spray Place 2 sprays into both nostrils daily. 09/17/16   Patrecia Pour, MD  HYDROcodone-homatropine (HYCODAN) 5-1.5 MG/5ML syrup Take 5 mLs by mouth every 6 (six) hours as needed for cough. 09/17/16   Patrecia Pour, MD  insulin glargine (LANTUS) 100 UNIT/ML injection Inject 28 Units into the skin every morning.     [provider]  meloxicam (MOBIC) 7.5 MG tablet Take 1 tablet (7.5 mg total) by mouth 2 (two) times daily as needed for pain. 04/23/16   Leandrew Koyanagi, MD  metFORMIN (GLUCOPHAGE) 500 MG tablet Take 1,000 mg by mouth 2 (two) times daily with a meal. @12  noon & @ 1700 08/04/12   Rosana Hoes, MD  pravastatin (PRAVACHOL) 20 MG tablet Take 20 mg by mouth every evening.    [provider]    Family History Family History  Problem Relation Age of Onset  . Diabetes Unknown   . Breast cancer Neg Hx     Social History Social History  Substance Use Topics  . Smoking status: Never Smoker  . Smokeless tobacco: Never Used  . Alcohol use  No     Allergies   Morphine and related   Review of Systems Review of Systems Per HPI.   Physical Exam Triage Vital Signs ED Triage Vitals  Enc Vitals Group     BP 09/17/16 1402 116/81     Pulse Rate 09/17/16 1402 100     Resp 09/17/16 1402 20     Temp 09/17/16 1402 98.7 F (37.1 C)     Temp Source 09/17/16 1402 Oral     SpO2 09/17/16 1402 99 %     Weight --      Height --      Head Circumference --      Peak Flow --      Pain Score 09/17/16 1400 6     Pain Loc --      Pain Edu? --      Excl. in [place]? --    No data found.   Updated Vital Signs BP 116/81 (BP Location: Right Arm)   Pulse 100   Temp 98.7 F (37.1 C) (Oral)   Resp 20   SpO2 99%   Visual Acuity Right Eye Distance:   Left Eye  Distance:   Bilateral Distance:    Right Eye Near:   Left Eye Near:    Bilateral Near:     Physical Exam  Constitutional: She is oriented to person, place, and time. She appears well-developed and well-nourished. No distress.  HENT:  Left TM retracted without effusion or injection. Left TM normal. Oropharynx erythematous with cobblestoning, no exudates or tonsillar enlargement.   Eyes: EOM are normal. Pupils are equal, round, and reactive to light. No scleral icterus.  Neck: Neck supple. No JVD present.  Cardiovascular: Normal rate, regular rhythm and intact distal pulses.   Murmur (Blowing II/VI early systolic murmur at RUSB radiating to carotid) heard. Pulmonary/Chest: Effort normal and breath sounds normal. No respiratory distress.  Persistent paroxysms of hacking cough  Abdominal: Soft. Bowel sounds are normal. She exhibits no distension. There is no tenderness.  Musculoskeletal: She exhibits no edema.  Lymphadenopathy:    She has no cervical adenopathy.  Neurological: She is alert and oriented to person, place, and time. She exhibits normal muscle tone.  Skin: Skin is warm and dry. Capillary refill takes less than 2 seconds. No rash noted.  Vitals reviewed.    UC Treatments / Results  Labs (all labs ordered are listed, but only abnormal results are displayed) Labs Reviewed - No data to display  EKG  EKG Interpretation None       Radiology No results found.  Procedures Procedures (including critical care time)  Medications Ordered in UC Medications - No data to display   Initial Impression / Assessment and Plan / UC Course  I have reviewed the triage vital signs and the nursing notes.  Pertinent labs & imaging results that were available during my care of the patient were reviewed by me and considered in my medical decision making (see chart for details).  Final Clinical Impressions(s) / UC Diagnoses   Final diagnoses:  Viral URI with cough   56 y.o.  female presenting with acute cough most likely related to viral and possibly also allergic rhinitis. No evidence of otitis media or pneumonia.  - Symptomatic management - Has follow up with PCP tomorrow, urged to keep this appointment - Expected course of illness and return precautions reviewed  New Prescriptions New Prescriptions   BENZONATATE (TESSALON) 100 MG CAPSULE    Take 1 capsule (100  mg total) by mouth every 8 (eight) hours.   FLUTICASONE (FLONASE) 50 MCG/ACT NASAL SPRAY    Place 2 sprays into both nostrils daily.   HYDROCODONE-HOMATROPINE (HYCODAN) 5-1.5 MG/5ML SYRUP    Take 5 mLs by mouth every 6 (six) hours as needed for cough.     Patrecia Pour, MD 09/17/16 [id number]

## 2016-09-17 NOTE — Discharge Instructions (Signed)
Try flonase as directed to assist with nasal symptoms and post nasal drip. You can also take tessalon as needed for cough during the day and hycodan cough syrup as needed at night. This contains hydrocodone which is related to morphine, so if you experience hives or trouble breathing after taking this, stop taking it and present for medical evaluation.

## 2016-09-18 DIAGNOSIS — E1165 Type 2 diabetes mellitus with hyperglycemia: Secondary | ICD-10-CM | POA: Diagnosis not present

## 2016-09-18 DIAGNOSIS — J069 Acute upper respiratory infection, unspecified: Secondary | ICD-10-CM | POA: Diagnosis not present

## 2016-09-18 DIAGNOSIS — G5602 Carpal tunnel syndrome, left upper limb: Secondary | ICD-10-CM | POA: Diagnosis not present

## 2016-09-18 DIAGNOSIS — M79676 Pain in unspecified toe(s): Secondary | ICD-10-CM | POA: Diagnosis not present

## 2017-01-22 DIAGNOSIS — E7849 Other hyperlipidemia: Secondary | ICD-10-CM | POA: Diagnosis not present

## 2017-01-22 DIAGNOSIS — E1165 Type 2 diabetes mellitus with hyperglycemia: Secondary | ICD-10-CM | POA: Diagnosis not present

## 2017-01-22 DIAGNOSIS — K146 Glossodynia: Secondary | ICD-10-CM | POA: Diagnosis not present

## 2017-01-22 DIAGNOSIS — Z23 Encounter for immunization: Secondary | ICD-10-CM | POA: Diagnosis not present

## 2017-01-22 DIAGNOSIS — M79672 Pain in left foot: Secondary | ICD-10-CM | POA: Diagnosis not present

## 2017-05-16 DIAGNOSIS — R82998 Other abnormal findings in urine: Secondary | ICD-10-CM | POA: Diagnosis not present

## 2017-05-16 DIAGNOSIS — E1165 Type 2 diabetes mellitus with hyperglycemia: Secondary | ICD-10-CM | POA: Diagnosis not present

## 2017-05-16 DIAGNOSIS — Z0000 Encounter for general adult medical examination without abnormal findings: Secondary | ICD-10-CM | POA: Diagnosis not present

## 2017-05-16 DIAGNOSIS — E7849 Other hyperlipidemia: Secondary | ICD-10-CM | POA: Diagnosis not present

## 2017-05-19 DIAGNOSIS — M859 Disorder of bone density and structure, unspecified: Secondary | ICD-10-CM | POA: Diagnosis not present

## 2017-05-20 DIAGNOSIS — M859 Disorder of bone density and structure, unspecified: Secondary | ICD-10-CM | POA: Diagnosis not present

## 2017-05-20 DIAGNOSIS — Z1389 Encounter for screening for other disorder: Secondary | ICD-10-CM | POA: Diagnosis not present

## 2017-05-20 DIAGNOSIS — F4321 Adjustment disorder with depressed mood: Secondary | ICD-10-CM | POA: Diagnosis not present

## 2017-05-20 DIAGNOSIS — M25572 Pain in left ankle and joints of left foot: Secondary | ICD-10-CM | POA: Diagnosis not present

## 2017-05-20 DIAGNOSIS — Z0000 Encounter for general adult medical examination without abnormal findings: Secondary | ICD-10-CM | POA: Diagnosis not present

## 2017-05-20 DIAGNOSIS — E559 Vitamin D deficiency, unspecified: Secondary | ICD-10-CM | POA: Diagnosis not present

## 2017-05-21 ENCOUNTER — Encounter: Payer: Self-pay | Admitting: Podiatry

## 2017-05-21 ENCOUNTER — Ambulatory Visit: Payer: BLUE CROSS/BLUE SHIELD | Admitting: Podiatry

## 2017-05-21 ENCOUNTER — Ambulatory Visit (INDEPENDENT_AMBULATORY_CARE_PROVIDER_SITE_OTHER): Payer: BLUE CROSS/BLUE SHIELD

## 2017-05-21 ENCOUNTER — Other Ambulatory Visit: Payer: Self-pay | Admitting: Podiatry

## 2017-05-21 DIAGNOSIS — M79672 Pain in left foot: Secondary | ICD-10-CM | POA: Diagnosis not present

## 2017-05-21 DIAGNOSIS — M779 Enthesopathy, unspecified: Secondary | ICD-10-CM

## 2017-05-21 DIAGNOSIS — L600 Ingrowing nail: Secondary | ICD-10-CM | POA: Diagnosis not present

## 2017-05-21 MED ORDER — TRIAMCINOLONE ACETONIDE 10 MG/ML IJ SUSP
10.0000 mg | Freq: Once | INTRAMUSCULAR | Status: AC
  Administered 2017-05-21: 10 mg

## 2017-05-22 NOTE — Progress Notes (Signed)
Subjective:   Patient ID: Vanessa Cole, female   DOB: 57 y.o.   MRN: [id number]   HPI Patient presents stating she is getting a lot of pain on top of her left foot and that also her left fifth toe on the outside can become sore and make it hard to wear shoe gear   ROS      Objective:  Physical Exam  Neurovascular status intact with patient noted to have pain around the left ankle around the extensor complex with mild edema noted and a rotated fifth digit left with keratotic lesion on the distal lateral aspect     Assessment:  Inflammatory tendinitis dorsum left with exostosis or keratotic lesion formation distal fifth left     Plan:  H&P condition reviewed and careful injection administered of the left tendon complex 3 mg Kenalog 5 mg Xylocaine and debrided lesion fifth toe and discussed possible exostectomy in the future  X-ray indicates that there is rotation of the toe with no indication of stress fracture dorsal surface

## 2017-06-04 DIAGNOSIS — Z1212 Encounter for screening for malignant neoplasm of rectum: Secondary | ICD-10-CM | POA: Diagnosis not present

## 2017-08-21 DIAGNOSIS — M25462 Effusion, left knee: Secondary | ICD-10-CM | POA: Diagnosis not present

## 2017-08-27 ENCOUNTER — Encounter: Payer: Self-pay | Admitting: Podiatry

## 2017-08-27 ENCOUNTER — Ambulatory Visit: Payer: BLUE CROSS/BLUE SHIELD | Admitting: Podiatry

## 2017-08-27 DIAGNOSIS — L600 Ingrowing nail: Secondary | ICD-10-CM | POA: Diagnosis not present

## 2017-08-27 DIAGNOSIS — M7752 Other enthesopathy of left foot: Secondary | ICD-10-CM | POA: Diagnosis not present

## 2017-08-27 DIAGNOSIS — M779 Enthesopathy, unspecified: Secondary | ICD-10-CM

## 2017-08-27 MED ORDER — TRIAMCINOLONE ACETONIDE 10 MG/ML IJ SUSP
10.0000 mg | Freq: Once | INTRAMUSCULAR | Status: AC
  Administered 2017-08-27: 10 mg

## 2017-08-27 NOTE — Patient Instructions (Signed)

## 2017-09-17 DIAGNOSIS — E1165 Type 2 diabetes mellitus with hyperglycemia: Secondary | ICD-10-CM | POA: Diagnosis not present

## 2017-09-17 DIAGNOSIS — Z794 Long term (current) use of insulin: Secondary | ICD-10-CM | POA: Diagnosis not present

## 2017-09-17 DIAGNOSIS — Z6825 Body mass index (BMI) 25.0-25.9, adult: Secondary | ICD-10-CM | POA: Diagnosis not present

## 2017-10-29 ENCOUNTER — Encounter: Payer: Self-pay | Admitting: *Deleted

## 2017-12-29 ENCOUNTER — Telehealth: Payer: Self-pay | Admitting: Podiatry

## 2017-12-29 NOTE — Telephone Encounter (Signed)
Pt wants to go ahead and get the order for her left foot. Please give her a call

## 2017-12-30 NOTE — Telephone Encounter (Signed)
Pt states she has received 2 injections in the area and it is painful again and she would like a MRI. I told pt her had not seen her since 08/2017 and would need to be reevaluated. Pt states understanding and I transferred to schedulers.

## 2017-12-30 NOTE — Telephone Encounter (Signed)
Left message requesting pt call to schedule an appt to be evaluated for the left foot problem.

## 2018-01-01 ENCOUNTER — Ambulatory Visit: Payer: BLUE CROSS/BLUE SHIELD | Admitting: Podiatry

## 2018-01-01 ENCOUNTER — Encounter: Payer: Self-pay | Admitting: Podiatry

## 2018-01-01 DIAGNOSIS — M779 Enthesopathy, unspecified: Secondary | ICD-10-CM

## 2018-01-02 ENCOUNTER — Telehealth: Payer: Self-pay | Admitting: *Deleted

## 2018-01-02 DIAGNOSIS — M79672 Pain in left foot: Secondary | ICD-10-CM

## 2018-01-02 DIAGNOSIS — M779 Enthesopathy, unspecified: Secondary | ICD-10-CM

## 2018-01-02 NOTE — Progress Notes (Signed)
Subjective:   Patient ID: Vanessa Cole, female   DOB: 57 y.o.   MRN: [id number]   HPI Patient continues to experience severe pain in the left medial foot and into the left ankle and states that is not responded all to previous injections immobilization anti-inflammatories and shoe gear modifications.  States that she is frustrated and feels there is no point in any further conservative care and wants MRI to find out if there is any torn tendon or arthritis or osteochondral issue with the ankle   ROS      Objective:  Physical Exam  Continued significant discomfort around the posterior tibial tendon into the navicular with discomfort in the ankle left around the ankle itself and the sinus tarsi     Assessment:  Very difficult to make determination as to whether or not there may be soft tissue trauma injury it is creating the chronic pain patient is experiencing     Plan:  I am going ahead and send her for an MRI of the left foot and ankle to rule out pathology associated with the mentioned conditions.  I do not know whether we will find anything that could be corrected and at this point we will wait for the results of that and decide what else we may be able to do to help

## 2018-01-02 NOTE — Telephone Encounter (Signed)
Orders to J. Quintana, RN for pre-cert, faxed to [facility]. 

## 2018-01-10 ENCOUNTER — Other Ambulatory Visit (HOSPITAL_COMMUNITY): Payer: BLUE CROSS/BLUE SHIELD

## 2018-01-13 ENCOUNTER — Encounter: Payer: Self-pay | Admitting: Internal Medicine

## 2018-01-13 ENCOUNTER — Ambulatory Visit (HOSPITAL_COMMUNITY)
Admission: RE | Admit: 2018-01-13 | Discharge: 2018-01-13 | Disposition: A | Discharge status: Home / Self Care | Payer: BLUE CROSS/BLUE SHIELD | Source: Ambulatory Visit | Attending: Podiatry | Admitting: Podiatry

## 2018-01-13 DIAGNOSIS — M779 Enthesopathy, unspecified: Secondary | ICD-10-CM | POA: Insufficient documentation

## 2018-01-13 DIAGNOSIS — M25472 Effusion, left ankle: Secondary | ICD-10-CM | POA: Diagnosis not present

## 2018-01-15 ENCOUNTER — Telehealth: Payer: Self-pay | Admitting: *Deleted

## 2018-01-15 NOTE — Telephone Encounter (Signed)
Let patient know that she has a lesion within her ankle joint that should be fixable

## 2018-01-15 NOTE — Telephone Encounter (Signed)
Left message informing pt Dr. Paulla Dolly had informed me of the results and I could discuss tomorrow, but he did want her to keep her appt.

## 2018-01-15 NOTE — Telephone Encounter (Signed)
Pt is scheduled to come in 01/19/2018.

## 2018-01-15 NOTE — Telephone Encounter (Signed)
Pt states she had her MRI performed and has not heard from anyone. I reviewed that the results were available and told pt she would need an appt to discuss with Dr. Paulla Dolly and transferred to scheduling.

## 2018-01-15 NOTE — Telephone Encounter (Signed)
-----   Message from Wallene Huh, DPM sent at 01/15/2018  4:46 PM EST ----- Let patient know that she has a lesion within her ankle joint that should be fixable

## 2018-01-16 NOTE — Telephone Encounter (Signed)
I informed pt of Dr. Mellody Drown review of the imaging results.

## 2018-01-19 ENCOUNTER — Ambulatory Visit: Payer: BLUE CROSS/BLUE SHIELD | Admitting: Podiatry

## 2018-01-19 ENCOUNTER — Encounter: Payer: Self-pay | Admitting: Podiatry

## 2018-01-19 DIAGNOSIS — M899 Disorder of bone, unspecified: Secondary | ICD-10-CM

## 2018-01-19 DIAGNOSIS — M779 Enthesopathy, unspecified: Secondary | ICD-10-CM | POA: Diagnosis not present

## 2018-01-19 DIAGNOSIS — M949 Disorder of cartilage, unspecified: Secondary | ICD-10-CM

## 2018-01-19 MED ORDER — TRIAMCINOLONE ACETONIDE 10 MG/ML IJ SUSP
10.0000 mg | Freq: Once | INTRAMUSCULAR | Status: AC
  Administered 2018-01-19: 10 mg

## 2018-02-02 ENCOUNTER — Encounter: Payer: Self-pay | Admitting: Podiatry

## 2018-02-02 ENCOUNTER — Ambulatory Visit: Payer: BLUE CROSS/BLUE SHIELD | Admitting: Podiatry

## 2018-02-02 DIAGNOSIS — M949 Disorder of cartilage, unspecified: Secondary | ICD-10-CM | POA: Diagnosis not present

## 2018-02-02 DIAGNOSIS — M899 Disorder of bone, unspecified: Secondary | ICD-10-CM | POA: Diagnosis not present

## 2018-02-02 DIAGNOSIS — M779 Enthesopathy, unspecified: Secondary | ICD-10-CM

## 2018-02-02 NOTE — Progress Notes (Signed)
Subjective:   Patient ID: Vanessa Cole, female   DOB: 57 y.o.   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 Carman Ching.  At this point were both in agreement that probable osteochondral drilling will be necessary in order to try to help but there is no guarantee will be able to make a difference for this problem.  Today I did go ahead and I did a careful cortisone injection of the medial ankle joint 3 mg Dexasone Kenalog 5 Milgram Xylocaine to see the response to decide what may be appropriate long-term the patient will be reevaluated in 2 weeks

## 2018-02-03 DIAGNOSIS — Z23 Encounter for immunization: Secondary | ICD-10-CM | POA: Diagnosis not present

## 2018-02-03 DIAGNOSIS — F4321 Adjustment disorder with depressed mood: Secondary | ICD-10-CM | POA: Diagnosis not present

## 2018-02-03 DIAGNOSIS — Z794 Long term (current) use of insulin: Secondary | ICD-10-CM | POA: Diagnosis not present

## 2018-02-03 DIAGNOSIS — M79672 Pain in left foot: Secondary | ICD-10-CM | POA: Diagnosis not present

## 2018-02-03 DIAGNOSIS — E1165 Type 2 diabetes mellitus with hyperglycemia: Secondary | ICD-10-CM | POA: Diagnosis not present

## 2018-02-04 NOTE — Progress Notes (Signed)
Subjective:   Patient ID: Vanessa Cole, female   DOB: 57 y.o.   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 Carman Ching and Pamala Hurry.  At this point I do think it is going to require surgery but we are going to watch it and see how she responds to injection and if she gets good results and that last her for 3 months at a time we may try to hold off.  Patient will be reevaluated again and does understand about surgery and if she requires surgery Dr. Carman Ching will be the primary surgeon on this case

## 2018-04-23 DIAGNOSIS — Z01419 Encounter for gynecological examination (general) (routine) without abnormal findings: Secondary | ICD-10-CM | POA: Diagnosis not present

## 2018-04-23 DIAGNOSIS — E119 Type 2 diabetes mellitus without complications: Secondary | ICD-10-CM | POA: Insufficient documentation

## 2018-04-23 DIAGNOSIS — Z1151 Encounter for screening for human papillomavirus (HPV): Secondary | ICD-10-CM | POA: Diagnosis not present

## 2018-04-23 DIAGNOSIS — Z803 Family history of malignant neoplasm of breast: Secondary | ICD-10-CM | POA: Insufficient documentation

## 2018-05-05 ENCOUNTER — Other Ambulatory Visit: Payer: Self-pay | Admitting: Internal Medicine

## 2018-05-05 DIAGNOSIS — Z1231 Encounter for screening mammogram for malignant neoplasm of breast: Secondary | ICD-10-CM

## 2018-05-18 ENCOUNTER — Emergency Department (HOSPITAL_COMMUNITY): Payer: BLUE CROSS/BLUE SHIELD

## 2018-05-18 ENCOUNTER — Emergency Department (HOSPITAL_COMMUNITY)
Admission: EM | Admit: 2018-05-18 | Discharge: 2018-05-18 | Disposition: A | Discharge status: Home / Self Care | Payer: BLUE CROSS/BLUE SHIELD | Attending: Emergency Medicine | Admitting: Emergency Medicine

## 2018-05-18 ENCOUNTER — Encounter (HOSPITAL_COMMUNITY): Payer: Self-pay | Admitting: *Deleted

## 2018-05-18 ENCOUNTER — Other Ambulatory Visit: Payer: Self-pay

## 2018-05-18 DIAGNOSIS — W19XXXA Unspecified fall, initial encounter: Secondary | ICD-10-CM

## 2018-05-18 DIAGNOSIS — E1165 Type 2 diabetes mellitus with hyperglycemia: Secondary | ICD-10-CM | POA: Diagnosis not present

## 2018-05-18 DIAGNOSIS — M859 Disorder of bone density and structure, unspecified: Secondary | ICD-10-CM | POA: Diagnosis not present

## 2018-05-18 DIAGNOSIS — Z794 Long term (current) use of insulin: Secondary | ICD-10-CM | POA: Diagnosis not present

## 2018-05-18 DIAGNOSIS — Z79899 Other long term (current) drug therapy: Secondary | ICD-10-CM | POA: Insufficient documentation

## 2018-05-18 DIAGNOSIS — S0990XA Unspecified injury of head, initial encounter: Secondary | ICD-10-CM | POA: Insufficient documentation

## 2018-05-18 DIAGNOSIS — Y939 Activity, unspecified: Secondary | ICD-10-CM | POA: Insufficient documentation

## 2018-05-18 DIAGNOSIS — E119 Type 2 diabetes mellitus without complications: Secondary | ICD-10-CM | POA: Diagnosis not present

## 2018-05-18 DIAGNOSIS — Z7982 Long term (current) use of aspirin: Secondary | ICD-10-CM | POA: Insufficient documentation

## 2018-05-18 DIAGNOSIS — W010XXA Fall on same level from slipping, tripping and stumbling without subsequent striking against object, initial encounter: Secondary | ICD-10-CM | POA: Diagnosis not present

## 2018-05-18 DIAGNOSIS — Y998 Other external cause status: Secondary | ICD-10-CM | POA: Insufficient documentation

## 2018-05-18 DIAGNOSIS — R82998 Other abnormal findings in urine: Secondary | ICD-10-CM | POA: Diagnosis not present

## 2018-05-18 DIAGNOSIS — E785 Hyperlipidemia, unspecified: Secondary | ICD-10-CM | POA: Insufficient documentation

## 2018-05-18 DIAGNOSIS — E7849 Other hyperlipidemia: Secondary | ICD-10-CM | POA: Diagnosis not present

## 2018-05-18 DIAGNOSIS — S0003XA Contusion of scalp, initial encounter: Secondary | ICD-10-CM | POA: Diagnosis not present

## 2018-05-18 DIAGNOSIS — Y929 Unspecified place or not applicable: Secondary | ICD-10-CM | POA: Diagnosis not present

## 2018-05-18 MED ORDER — ACETAMINOPHEN 325 MG PO TABS
650.0000 mg | ORAL_TABLET | Freq: Once | ORAL | Status: AC
  Administered 2018-05-18: 650 mg via ORAL
  Filled 2018-05-18: qty 2

## 2018-05-18 MED ORDER — ONDANSETRON 4 MG PO TBDP
4.0000 mg | ORAL_TABLET | Freq: Once | ORAL | Status: AC
  Administered 2018-05-18: 4 mg via ORAL
  Filled 2018-05-18: qty 1

## 2018-05-18 NOTE — ED Notes (Signed)
Patient verbalizes understanding of discharge instructions . Opportunity for questions and answers were provided . Armband removed by staff ,Pt discharged from ED. W/C  offered at D/C  and Declined W/C at D/C and was escorted to lobby by RN.  

## 2018-05-18 NOTE — Discharge Instructions (Addendum)
Please read attached information. If you experience any new or worsening signs or symptoms please return to the emergency room for evaluation. Please follow-up with your primary care provider or specialist as discussed.  °

## 2018-05-18 NOTE — ED Triage Notes (Signed)
PT reports slipping in water and fell back and hit the back of her head. Pt reports nausea, Pt A/O on arrival to room . Pt denies LOC at time of fall.

## 2018-05-18 NOTE — ED Provider Notes (Signed)
[place] EMERGENCY DEPARTMENT Provider Note   CSN: [id number] Arrival date & time: 05/18/18  1007    History   Chief Complaint Chief Complaint  Patient presents with  . Fall    HPI Vanessa Cole is a 58 y.o. female.     HPI   59 year old female presents today status post fall.  Patient shortly prior to arrival in the emergency room patient slipped falling back and hitting the posterior aspect of her head.  She notes no loss of consciousness.  She denies any neck pain, chest pain abdominal pain or extremity pain.  She denies any neurological deficits.  She notes she is nauseous but reports no vomiting.  She notes that she had a nisin fundoplication and since has been unable to vomit.  No medications prior to arrival.  She notes a generalized headache.  She is not currently on blood thinners.  Past Medical History:  Diagnosis Date  . Diabetes mellitus without complication ([place])   . Fibromyalgia   . Gallstones   . Heart murmur   . Hyperlipidemia   . Rosacea     Patient Active Problem List   Diagnosis Date Noted  . Cholelithiasis with chronic cholecystitis 04/14/2014  . Cholelithiasis with cholecystitis 09/08/2012  . Metatarsalgia of right foot 08/19/2012  . Deformity of metatarsal 08/19/2012    Past Surgical History:  Procedure Laterality Date  . APPENDECTOMY    . CHOLECYSTECTOMY N/A 04/14/2014   Procedure: LAPAROSCOPIC CHOLECYSTECTOMY WITH INTRAOPERATIVE CHOLANGIOGRAM;  Surgeon: [name], MD;  Location: [facility];  Service: General;  Laterality: N/A;  . FOOT SURGERY    . HERNIA REPAIR    . KNEE ARTHROSCOPY    . NISSEN FUNDOPLICATION    . ulcer surgery     same as NISSEN fundiplication     OB History   No obstetric history on file.      Home Medications    Prior to Admission medications   Medication Sig Start Date End Date Taking? Authorizing Provider  aspirin 81 MG tablet Take 81 mg by mouth daily at 12 noon.     [provider]  benzonatate (TESSALON) 100 MG capsule Take 1 capsule (100 mg total) by mouth every 8 (eight) hours. 09/17/16   Patrecia Pour, MD  D3-50 50000 units capsule  06/02/17   [provider]  doxycycline (VIBRA-TABS) 100 MG tablet Take 100 mg by mouth 2 (two) times daily. For rosacia-- @12  noon & @ 1700    [provider]  doxycycline (VIBRAMYCIN) 100 MG capsule  11/17/17   [provider]  fluticasone (FLONASE) 50 MCG/ACT nasal spray Place 2 sprays into both nostrils daily. 09/17/16   Patrecia Pour, MD  HYDROcodone-homatropine (HYCODAN) 5-1.5 MG/5ML syrup Take 5 mLs by mouth every 6 (six) hours as needed for cough. 09/17/16   Patrecia Pour, MD  insulin glargine (LANTUS) 100 UNIT/ML injection Inject 28 Units into the skin every morning.     [provider]  meloxicam (MOBIC) 7.5 MG tablet Take 1 tablet (7.5 mg total) by mouth 2 (two) times daily as needed for pain. 04/23/16   Leandrew Koyanagi, MD  metFORMIN (GLUCOPHAGE) 1000 MG tablet  07/29/17   [provider]  metFORMIN (GLUCOPHAGE) 500 MG tablet Take 1,000 mg by mouth 2 (two) times daily with a meal. @12  noon & @ 1700 08/04/12   Rosana Hoes, MD  pravastatin (PRAVACHOL) 20 MG tablet Take 20 mg by mouth every evening.  [provider]    Family History Family History  Problem Relation Age of Onset  . Diabetes Other   . Breast cancer Neg Hx     Social History Social History   Tobacco Use  . Smoking status: Never Smoker  . Smokeless tobacco: Never Used  Substance Use Topics  . Alcohol use: No  . Drug use: No     Allergies   Morphine and related   Review of Systems Review of Systems  All other systems reviewed and are negative.   Physical Exam Updated Vital Signs BP 139/90 (BP Location: Right Arm)   Pulse 81   Temp 98.7 F (37.1 C) (Oral)   Resp 17   Ht 5\' 2"  (1.575 m)   Wt 71.2 kg   SpO2 97%   BMI 28.72 kg/m   Physical Exam Vitals signs and nursing note  reviewed.  Constitutional:      Appearance: She is well-developed.  HENT:     Head: Normocephalic and atraumatic.     Comments: Hematoma to the posterior scalp, no laceration Eyes:     General: No scleral icterus.       Right eye: No discharge.        Left eye: No discharge.     Conjunctiva/sclera: Conjunctivae normal.     Pupils: Pupils are equal, round, and reactive to light.  Neck:     Musculoskeletal: Normal range of motion.     Vascular: No JVD.     Trachea: No tracheal deviation.  Pulmonary:     Effort: Pulmonary effort is normal.     Breath sounds: No stridor.  Musculoskeletal:     Comments: No CT or L-spine tenderness palpation  Neurological:     Mental Status: She is alert and oriented to person, place, and time.     GCS: GCS eye subscore is 4. GCS verbal subscore is 5. GCS motor subscore is 6.     Cranial Nerves: No cranial nerve deficit.     Sensory: Sensation is intact. No sensory deficit.     Motor: No weakness.     Coordination: Coordination normal.  Psychiatric:        Behavior: Behavior normal.        Thought Content: Thought content normal.        Judgment: Judgment normal.     ED Treatments / Results  Labs (all labs ordered are listed, but only abnormal results are displayed) Labs Reviewed - No data to display  EKG None  Radiology Ct Head Wo Contrast  Result Date: 05/18/2018 CLINICAL DATA:  The patient suffered a slip and fall in water today with a blow to the back of the head. Blurry vision. Initial encounter. EXAM: CT HEAD WITHOUT CONTRAST TECHNIQUE: Contiguous axial images were obtained from the base of the skull through the vertex without intravenous contrast. COMPARISON:  Head CT scan 10/05/2011. FINDINGS: Brain: No evidence of acute infarction, hemorrhage, hydrocephalus, extra-axial collection or mass lesion/mass effect. Vascular: Negative. Skull: Intact.  No focal lesion. Sinuses/Orbits: Negative. Other: Scalp contusion is seen posteriorly.  IMPRESSION: Scalp contusion posterior aspect of the head without underlying fracture or intracranial abnormality. Electronically Signed   By: Inge Rise M.D.   On: 05/18/2018 12:25    Procedures Procedures (including critical care time)  Medications Ordered in ED Medications  acetaminophen (TYLENOL) tablet 650 mg (650 mg Oral Given 05/18/18 1101)  ondansetron (ZOFRAN-ODT) disintegrating tablet 4 mg (4 mg Oral Given 05/18/18 1101)  Initial Impression / Assessment and Plan / ED Course  I have reviewed the triage vital signs and the nursing notes.  Pertinent labs & imaging results that were available during my care of the patient were reviewed by me and considered in my medical decision making (see chart for details).        Labs:   Imaging: CT Head WO  Consults:  Therapeutics: zofran Tylenol   Discharge Meds:   Assessment/Plan: 58 year old female presents status post fall.  She has no signs of acute intracranial abnormality.  Headache improved with Tylenol.  Discharged with return precautions.  She verbalized understanding and agreement to today's plan.    Final Clinical Impressions(s) / ED Diagnoses   Final diagnoses:  Fall, initial encounter  Injury of head, initial encounter    ED Discharge Orders    None       Francee Gentile 05/18/18 1415    Virgel Manifold, MD 05/20/18 1535

## 2018-05-21 ENCOUNTER — Ambulatory Visit: Payer: BLUE CROSS/BLUE SHIELD | Admitting: Podiatry

## 2018-05-21 ENCOUNTER — Encounter: Payer: Self-pay | Admitting: Podiatry

## 2018-05-21 ENCOUNTER — Other Ambulatory Visit: Payer: Self-pay

## 2018-05-21 DIAGNOSIS — M779 Enthesopathy, unspecified: Secondary | ICD-10-CM | POA: Diagnosis not present

## 2018-05-21 MED ORDER — TRIAMCINOLONE ACETONIDE 10 MG/ML IJ SUSP
10.0000 mg | Freq: Once | INTRAMUSCULAR | Status: AC
  Administered 2018-05-21: 10 mg

## 2018-05-25 DIAGNOSIS — Z0000 Encounter for general adult medical examination without abnormal findings: Secondary | ICD-10-CM | POA: Diagnosis not present

## 2018-05-25 DIAGNOSIS — E559 Vitamin D deficiency, unspecified: Secondary | ICD-10-CM | POA: Diagnosis not present

## 2018-05-25 DIAGNOSIS — Z1331 Encounter for screening for depression: Secondary | ICD-10-CM | POA: Diagnosis not present

## 2018-05-25 DIAGNOSIS — E1165 Type 2 diabetes mellitus with hyperglycemia: Secondary | ICD-10-CM | POA: Diagnosis not present

## 2018-05-25 DIAGNOSIS — Z23 Encounter for immunization: Secondary | ICD-10-CM | POA: Diagnosis not present

## 2018-05-25 DIAGNOSIS — Z794 Long term (current) use of insulin: Secondary | ICD-10-CM | POA: Diagnosis not present

## 2018-05-25 DIAGNOSIS — M79672 Pain in left foot: Secondary | ICD-10-CM | POA: Diagnosis not present

## 2018-05-25 NOTE — Progress Notes (Signed)
Subjective:   Patient ID: Vanessa Cole, female   DOB: 58 y.o.   MRN: [id number]   HPI Patient presents with chronic discomfort in the lateral side of both feet with tendon irritation and pain with palpation and depression of the arch noted bilateral   ROS      Objective:  Physical Exam  Acute tendinitis of the peroneal groove bilateral with inflammation fluid buildup and depression of the arch bilateral     Assessment:  Acute pain in the lateral side of both feet is pressed with moderate depression of the arch noted bilateral     Plan:  Reviewed questions and went ahead today did sterile prep of each foot and injected the she 3 mg Kenalog 5 mg Xylocaine and advised on anti-inflammatories physical therapy and casted for functional orthotics to lift up the arches.  Reappoint when returned and will be dispensed by ped orthotist

## 2018-05-26 DIAGNOSIS — Z1212 Encounter for screening for malignant neoplasm of rectum: Secondary | ICD-10-CM | POA: Diagnosis not present

## 2018-06-01 ENCOUNTER — Ambulatory Visit: Payer: BLUE CROSS/BLUE SHIELD

## 2018-06-15 ENCOUNTER — Other Ambulatory Visit: Payer: BLUE CROSS/BLUE SHIELD | Admitting: Orthotics

## 2018-06-16 ENCOUNTER — Other Ambulatory Visit: Payer: BLUE CROSS/BLUE SHIELD | Admitting: Orthotics

## 2018-07-20 DIAGNOSIS — E119 Type 2 diabetes mellitus without complications: Secondary | ICD-10-CM | POA: Diagnosis not present

## 2018-07-20 DIAGNOSIS — Z794 Long term (current) use of insulin: Secondary | ICD-10-CM | POA: Diagnosis not present

## 2018-08-06 DIAGNOSIS — R21 Rash and other nonspecific skin eruption: Secondary | ICD-10-CM | POA: Diagnosis not present

## 2018-08-18 ENCOUNTER — Ambulatory Visit: Payer: BLUE CROSS/BLUE SHIELD | Admitting: Podiatry

## 2018-08-18 ENCOUNTER — Ambulatory Visit (INDEPENDENT_AMBULATORY_CARE_PROVIDER_SITE_OTHER): Payer: BLUE CROSS/BLUE SHIELD

## 2018-08-18 ENCOUNTER — Other Ambulatory Visit: Payer: Self-pay

## 2018-08-18 ENCOUNTER — Encounter: Payer: Self-pay | Admitting: Podiatry

## 2018-08-18 DIAGNOSIS — M79673 Pain in unspecified foot: Secondary | ICD-10-CM | POA: Diagnosis not present

## 2018-08-18 DIAGNOSIS — M779 Enthesopathy, unspecified: Secondary | ICD-10-CM

## 2018-08-18 DIAGNOSIS — M79672 Pain in left foot: Secondary | ICD-10-CM | POA: Diagnosis not present

## 2018-08-18 DIAGNOSIS — M79671 Pain in right foot: Secondary | ICD-10-CM | POA: Diagnosis not present

## 2018-08-23 NOTE — Progress Notes (Signed)
Subjective: 58 year old female presents the office today for concerns of foot pain on both sides more on the medial aspect and she requested injections.  She had injections previously that were helpful and seemed to try an injection on the inside aspect of her feet.  She denies any recent injury or trauma.  She states this was in the arch area.  No swelling or redness.  No numbness or tingling. Denies any systemic complaints such as fevers, chills, nausea, vomiting. No acute changes since last appointment, and no other complaints at this time.   Objective: AAO x3, NAD DP/PT pulses palpable bilaterally, CRT less than 3 seconds Tenderness occasional medial aspect of bilateral feet more along the medial aspect the medial cuneiform.  No significant discomfort in the course of the tibialis anterior tendon.  No pain of the posterior tibial tendon.  No pain to vibratory sensation.  No edema, erythema.  No open lesions or pre-ulcerative lesions.  No pain with calf compression, swelling, warmth, erythema  Assessment: Bilateral foot pain  Plan: -All treatment options discussed with the patient including all alternatives, risks, complications.  -X-rays were obtained reviewed.  Skin markers were utilized to identify the area of the pain.  No evidence of acute fracture. -Today steroid injections performed to the medial aspect the foot.  Care was taken not to infiltrate into the tibialis anterior insertion as well the medial aspect of the medial cuneiform.  The skin was prepped with alcohol and then a mixture of half cc dexamethasone phosphate and half cc Marcaine was infiltrated bilaterally.  She tolerated well without any complications.  Discussed along with activity.  Ice elevation. -Patient encouraged to call the office with any questions, concerns, change in symptoms.   Trula Slade DPM

## 2018-09-09 ENCOUNTER — Ambulatory Visit: Payer: BLUE CROSS/BLUE SHIELD | Admitting: Podiatry

## 2018-10-16 DIAGNOSIS — E1165 Type 2 diabetes mellitus with hyperglycemia: Secondary | ICD-10-CM | POA: Diagnosis not present

## 2018-10-20 DIAGNOSIS — F4321 Adjustment disorder with depressed mood: Secondary | ICD-10-CM | POA: Diagnosis not present

## 2018-10-20 DIAGNOSIS — E1165 Type 2 diabetes mellitus with hyperglycemia: Secondary | ICD-10-CM | POA: Diagnosis not present

## 2018-10-20 DIAGNOSIS — M797 Fibromyalgia: Secondary | ICD-10-CM | POA: Diagnosis not present

## 2018-10-20 DIAGNOSIS — E1140 Type 2 diabetes mellitus with diabetic neuropathy, unspecified: Secondary | ICD-10-CM | POA: Diagnosis not present

## 2018-11-06 DIAGNOSIS — E1165 Type 2 diabetes mellitus with hyperglycemia: Secondary | ICD-10-CM | POA: Diagnosis not present

## 2018-11-06 DIAGNOSIS — Z794 Long term (current) use of insulin: Secondary | ICD-10-CM | POA: Diagnosis not present

## 2018-11-06 DIAGNOSIS — E1140 Type 2 diabetes mellitus with diabetic neuropathy, unspecified: Secondary | ICD-10-CM | POA: Diagnosis not present

## 2018-11-06 DIAGNOSIS — M779 Enthesopathy, unspecified: Secondary | ICD-10-CM | POA: Diagnosis not present

## 2019-09-25 ENCOUNTER — Other Ambulatory Visit: Payer: Self-pay

## 2019-09-25 ENCOUNTER — Ambulatory Visit (HOSPITAL_COMMUNITY)
Admission: EM | Admit: 2019-09-25 | Discharge: 2019-09-25 | Disposition: A | Discharge status: Home / Self Care | Payer: BLUE CROSS/BLUE SHIELD | Attending: Physician Assistant | Admitting: Physician Assistant

## 2019-09-25 ENCOUNTER — Ambulatory Visit (INDEPENDENT_AMBULATORY_CARE_PROVIDER_SITE_OTHER): Payer: BLUE CROSS/BLUE SHIELD

## 2019-09-25 ENCOUNTER — Encounter (HOSPITAL_COMMUNITY): Payer: Self-pay

## 2019-09-25 DIAGNOSIS — S62512A Displaced fracture of proximal phalanx of left thumb, initial encounter for closed fracture: Secondary | ICD-10-CM | POA: Diagnosis not present

## 2019-09-25 DIAGNOSIS — S6702XA Crushing injury of left thumb, initial encounter: Secondary | ICD-10-CM | POA: Diagnosis not present

## 2019-09-25 MED ORDER — ACETAMINOPHEN 500 MG PO TABS: 1000.0000 mg | ORAL_TABLET | Freq: Four times a day (QID) | ORAL | 0 refills | Status: DC | PRN

## 2019-09-25 MED ORDER — ACETAMINOPHEN 325 MG PO TABS
975.0000 mg | ORAL_TABLET | Freq: Once | ORAL | Status: AC
  Administered 2019-09-25: 975 mg via ORAL

## 2019-09-25 MED ORDER — ACETAMINOPHEN 325 MG PO TABS
ORAL_TABLET | ORAL | Status: AC
  Filled 2019-09-25: qty 3

## 2019-09-25 NOTE — Discharge Instructions (Addendum)
Continue the aleeve at home per labeling  Take 2 extra strength tylenol every 8 hours as needed for pain  Wear the splint at all times  Call dr. Carita Pian office on Monday to schedule follow up this week

## 2019-09-25 NOTE — ED Triage Notes (Signed)
Pt presents with swelling and pin in the lft thumb x 3 weeks, after smashed with a veggie chopper.

## 2019-09-25 NOTE — ED Notes (Signed)
Ortho tech present for splint application.

## 2019-09-25 NOTE — ED Notes (Signed)
Page to ortho tech for thumb spica application.

## 2019-09-25 NOTE — ED Provider Notes (Signed)
[place]    CSN: [id number] Arrival date & time: 09/25/19  1525      History   Chief Complaint Chief Complaint  Patient presents with  . Finger Injury    HPI Vanessa Cole is a 59 y.o. female.   Patient reports for evaluation of left thumb pain.  She reports around 3 weeks ago she smashed the thumb with a handle of a onion cutter.  She reports lots of swelling and bruising at that time.  Since then the bruising and swelling is subsided however she does continue to have pain in the thumb joint.  She has had limited range of motion due to the pain.  She reports last night it was throbbing and this finally prompted her to come in.  She denies any numbness or tingling in the thumb. She has been taking naproxen for pain.      Past Medical History:  Diagnosis Date  . Diabetes mellitus without complication ([place])   . Fibromyalgia   . Gallstones   . Heart murmur   . Hyperlipidemia   . Rosacea     Patient Active Problem List   Diagnosis Date Noted  . Family history of breast cancer 04/23/2018  . Type 2 diabetes mellitus ([place]) 04/23/2018  . Cholelithiasis with chronic cholecystitis 04/14/2014  . Cholelithiasis with cholecystitis 09/08/2012  . Metatarsalgia of right foot 08/19/2012  . Deformity of metatarsal 08/19/2012    Past Surgical History:  Procedure Laterality Date  . APPENDECTOMY    . CHOLECYSTECTOMY N/A 04/14/2014   Procedure: LAPAROSCOPIC CHOLECYSTECTOMY WITH INTRAOPERATIVE CHOLANGIOGRAM;  Surgeon: [name], MD;  Location: [facility];  Service: General;  Laterality: N/A;  . FOOT SURGERY    . HERNIA REPAIR    . KNEE ARTHROSCOPY    . NISSEN FUNDOPLICATION    . ulcer surgery     same as NISSEN fundiplication    OB History   No obstetric history on file.      Home Medications    Prior to Admission medications   Medication Sig Start Date End Date Taking? Authorizing Provider  acetaminophen (TYLENOL) 500 MG tablet Take 2 tablets (1,000 mg total)  by mouth every 6 (six) hours as needed. 09/25/19   Larisa Lanius, Marguerita Beards, PA-C  aspirin 81 MG tablet Take 81 mg by mouth daily at 12 noon.     [provider]  benzonatate (TESSALON) 100 MG capsule Take 1 capsule (100 mg total) by mouth every 8 (eight) hours. 09/17/16   Patrecia Pour, MD  D3-50 50000 units capsule  06/02/17   [provider]  doxycycline (VIBRA-TABS) 100 MG tablet Take 100 mg by mouth 2 (two) times daily. For rosacia-- @12  noon & @ 1700    [provider]  doxycycline (VIBRAMYCIN) 100 MG capsule  11/17/17   [provider]  fluticasone (FLONASE) 50 MCG/ACT nasal spray Place 2 sprays into both nostrils daily. 09/17/16   Patrecia Pour, MD  HYDROcodone-homatropine (HYCODAN) 5-1.5 MG/5ML syrup Take 5 mLs by mouth every 6 (six) hours as needed for cough. 09/17/16   Patrecia Pour, MD  insulin glargine (LANTUS) 100 UNIT/ML injection Inject 28 Units into the skin every morning.     [provider]  meloxicam (MOBIC) 7.5 MG tablet Take 1 tablet (7.5 mg total) by mouth 2 (two) times daily as needed for pain. 04/23/16   Leandrew Koyanagi, MD  metFORMIN (GLUCOPHAGE) 1000 MG tablet  07/29/17   [provider]  metFORMIN (  GLUCOPHAGE) 500 MG tablet Take 1,000 mg by mouth 2 (two) times daily with a meal. @12  noon & @ 1700 08/04/12   Rosana Hoes, MD  pravastatin (PRAVACHOL) 20 MG tablet Take 20 mg by mouth every evening.    [provider]  triamcinolone cream (KENALOG) 0.1 %  08/07/18   [provider]    Family History Family History  Problem Relation Age of Onset  . Diabetes Other   . Breast cancer Neg Hx     Social History Social History   Tobacco Use  . Smoking status: Never Smoker  . Smokeless tobacco: Never Used  Substance Use Topics  . Alcohol use: No  . Drug use: No     Allergies   Morphine and related   Review of Systems Review of Systems   Physical Exam Triage Vital Signs ED Triage Vitals  Enc Vitals Group      BP 09/25/19 1636 (!) 149/92     Pulse Rate 09/25/19 1636 78     Resp 09/25/19 1636 17     Temp 09/25/19 1636 98.2 F (36.8 C)     Temp Source 09/25/19 1636 Oral     SpO2 09/25/19 1636 98 %     Weight --      Height --      Head Circumference --      Peak Flow --      Pain Score 09/25/19 1635 10     Pain Loc --      Pain Edu? --      Excl. in [place]? --    No data found.  Updated Vital Signs BP (!) 149/92 (BP Location: Right Arm)   Pulse 78   Temp 98.2 F (36.8 C) (Oral)   Resp 17   SpO2 98%   Visual Acuity Right Eye Distance:   Left Eye Distance:   Bilateral Distance:    Right Eye Near:   Left Eye Near:    Bilateral Near:     Physical Exam Vitals and nursing note reviewed.  Constitutional:      Appearance: Normal appearance.  Musculoskeletal:     Comments: There is no deformity ecchymosis or swelling of the left hand.  Left thumb with mild swelling around the DIP and proximal phalanx, tenderness about the DIP and phalanx.  Limited range of motion due to pain.    Sensation intact, cap refill <2 sec  There is no other TTP or injury in the left hand, freely moving remainder of hand  Skin:    General: Skin is warm and dry.  Neurological:     Mental Status: She is alert.      UC Treatments / Results  Labs (all labs ordered are listed, but only abnormal results are displayed) Labs Reviewed - No data to display  EKG   Radiology DG Finger Thumb Left  Result Date: 09/25/2019 CLINICAL DATA:  Crush injury to the left thumb. EXAM: LEFT THUMB 2+V COMPARISON:  None. FINDINGS: There is a transverse comminuted mildly dorsally angulated fracture of the mid proximal first phalanx. Associated soft tissue swelling. IMPRESSION: Transverse comminuted mildly dorsally angulated fracture of the mid proximal first phalanx. Electronically Signed   By: Fidela Salisbury M.D.   On: 09/25/2019 18:54    Procedures Procedures (including critical care time)  Medications Ordered in  UC Medications  acetaminophen (TYLENOL) tablet 975 mg (975 mg Oral Given 09/25/19 1907)    Initial Impression / Assessment and Plan / UC Course  I have reviewed the triage vital signs and the nursing notes.  Pertinent labs & imaging results that were available during my care of the patient were reviewed by me and considered in my medical decision making (see chart for details).     #Close displace thumb fracture- left Patient is a 59 year old with fracture to proximal phalanx of left thumb. 13 week old injury. Neurovascularly intact. Placed in thumb spica splint by orthotech. Patient has opiate allergy, feel with splint, we can achieve good pain management with NSAID and APAP combination. Will have her follow up with Hand, Dr.Kuzma's information given, instructed to call Monday morning. Patient verbalized understnading plan of care.  Final Clinical Impressions(s) / UC Diagnoses   Final diagnoses:  Closed displaced fracture of proximal phalanx of left thumb, initial encounter     Discharge Instructions     Continue the aleeve at home per labeling  Take 2 extra strength tylenol every 8 hours as needed for pain  Wear the splint at all times  Call dr. Carita Pian office on Monday to schedule follow up this week      ED Prescriptions    Medication Sig Dispense Auth. Provider   acetaminophen (TYLENOL) 500 MG tablet Take 2 tablets (1,000 mg total) by mouth every 6 (six) hours as needed. 30 tablet Josef Tourigny, Marguerita Beards, PA-C     PDMP not reviewed this encounter.   Purnell Shoemaker, PA-C 09/26/19 1157

## 2019-09-25 NOTE — Progress Notes (Signed)
Orthopedic Tech Progress Note Patient Details:  Vanessa Cole November 07, 1960 [id number]  Ortho Devices Type of Ortho Device: Thumb spica splint Splint Material: Fiberglass Ortho Device/Splint Location: LUE Ortho Device/Splint Interventions: Application   Post Interventions Patient Tolerated: Well Instructions Provided: Care of device   Scott Vanderveer E [name] 09/25/2019, 7:15 PM

## 2019-09-25 NOTE — ED Notes (Signed)
Ortho tech notified of splint request.  Pt updated on wait for splint application.

## 2020-10-18 ENCOUNTER — Other Ambulatory Visit: Payer: Self-pay | Admitting: Internal Medicine

## 2020-12-07 NOTE — Telephone Encounter (Signed)
Error

## 2021-01-26 ENCOUNTER — Ambulatory Visit: Payer: BLUE CROSS/BLUE SHIELD | Admitting: Physician Assistant

## 2021-01-26 ENCOUNTER — Other Ambulatory Visit: Payer: Self-pay

## 2021-10-17 ENCOUNTER — Ambulatory Visit: Payer: BLUE CROSS/BLUE SHIELD | Admitting: Physician Assistant

## 2022-01-02 ENCOUNTER — Other Ambulatory Visit: Payer: Self-pay

## 2022-01-02 MED ORDER — OZEMPIC (1 MG/DOSE) 4 MG/3ML ~~LOC~~ SOPN
1.0000 mg | PEN_INJECTOR | SUBCUTANEOUS | 6 refills | Status: AC
  Filled 2022-01-02: qty 3, 28d supply, fill #0
  Filled 2022-02-01: qty 3, 28d supply, fill #1
  Filled 2022-03-01: qty 3, 28d supply, fill #2
  Filled 2022-03-28 (×2): qty 3, 28d supply, fill #3
  Filled 2022-04-25 – 2022-05-20 (×2): qty 3, 28d supply, fill #4
  Filled 2022-06-21: qty 3, 28d supply, fill #5

## 2022-01-07 ENCOUNTER — Other Ambulatory Visit: Payer: Self-pay

## 2022-01-25 ENCOUNTER — Encounter (HOSPITAL_COMMUNITY): Payer: Self-pay

## 2022-01-25 ENCOUNTER — Emergency Department (HOSPITAL_COMMUNITY)
Admission: EM | Admit: 2022-01-25 | Discharge: 2022-01-25 | Discharge status: Against Medical Advice | Payer: BLUE CROSS/BLUE SHIELD | Attending: Emergency Medicine | Admitting: Emergency Medicine

## 2022-01-25 ENCOUNTER — Other Ambulatory Visit: Payer: Self-pay

## 2022-01-25 DIAGNOSIS — R079 Chest pain, unspecified: Secondary | ICD-10-CM | POA: Diagnosis not present

## 2022-01-25 DIAGNOSIS — R200 Anesthesia of skin: Secondary | ICD-10-CM | POA: Insufficient documentation

## 2022-01-25 DIAGNOSIS — Z5321 Procedure and treatment not carried out due to patient leaving prior to being seen by health care provider: Secondary | ICD-10-CM | POA: Insufficient documentation

## 2022-01-25 DIAGNOSIS — R9431 Abnormal electrocardiogram [ECG] [EKG]: Secondary | ICD-10-CM | POA: Diagnosis present

## 2022-01-25 NOTE — ED Triage Notes (Signed)
PER EMS: pt arrives from her PCP office with c/o 12 lead abnormality "concerns with ST segment changes in V4 and V3." She reports left arm and hand numbness onset 2 weeks ago associated with mild SOB. She denies CP right now but last had it 2 weeks ago.  BP- 180/100, HR-96, O2-98%, CBG-174

## 2022-01-25 NOTE — ED Provider Triage Note (Signed)
Emergency Medicine Provider Triage Evaluation Note  Promise Weldin , a 61 y.o. female  was evaluated in triage.  Pt was sent to ER by PCP office with concern of EKG abnormality, ST segment changes in V4 and V3.  She reports she went to the doctor today regarding left hand and arm numbness specifically the third and fourth finger.  Denies pain.  She reports she did have mild chest pain the day following her colonoscopy a couple of weeks ago but has since resolved.  Denies chest pain, shortness of breath, syncope, lightheadedness, weakness.  Review of Systems  Positive: See above Negative: See above  Physical Exam  BP (!) 164/89 (BP Location: Right Arm)   Pulse (!) 111   Temp 98.7 F (37.1 C)   Resp 16   Ht '5\' 4"'$  (1.626 m)   Wt 66.7 kg   SpO2 94%   BMI 25.23 kg/m  Gen:   Awake, no distress   Resp:  Normal effort  MSK:   Moves extremities without difficulty, no weakness in upper extremities, decreased sensation to third and fourth fingers left arm, motor intact; sensation of left arm intact   Medical Decision Making  Medically screening exam initiated at 5:16 PM.  Appropriate orders placed.  Emmah Bratcher was informed that the remainder of the evaluation will be completed by another provider, this initial triage assessment does not replace that evaluation, and the importance of remaining in the ED until their evaluation is complete.     Theressa Stamps R, [place] 01/25/22 1727

## 2022-01-25 NOTE — ED Notes (Signed)
Pt left due to long wait times

## 2022-02-01 ENCOUNTER — Other Ambulatory Visit: Payer: Self-pay

## 2022-02-12 ENCOUNTER — Encounter: Payer: Self-pay | Admitting: Cardiovascular Disease

## 2022-02-12 ENCOUNTER — Ambulatory Visit: Payer: BLUE CROSS/BLUE SHIELD | Attending: Cardiovascular Disease | Admitting: Cardiovascular Disease

## 2022-02-12 VITALS — BP 148/90 | HR 93 | Ht 64.0 in | Wt 144.4 lb

## 2022-02-12 DIAGNOSIS — R202 Paresthesia of skin: Secondary | ICD-10-CM

## 2022-02-12 DIAGNOSIS — R200 Anesthesia of skin: Secondary | ICD-10-CM

## 2022-02-12 DIAGNOSIS — I10 Essential (primary) hypertension: Secondary | ICD-10-CM | POA: Diagnosis not present

## 2022-02-12 DIAGNOSIS — I340 Nonrheumatic mitral (valve) insufficiency: Secondary | ICD-10-CM | POA: Diagnosis not present

## 2022-02-12 MED ORDER — VALSARTAN 80 MG PO TABS: 80.0000 mg | ORAL_TABLET | Freq: Every day | ORAL | 3 refills | Status: DC

## 2022-02-12 MED ORDER — METOPROLOL SUCCINATE ER 25 MG PO TB24: 25.0000 mg | ORAL_TABLET | Freq: Every day | ORAL | 3 refills | Status: DC

## 2022-02-12 NOTE — Patient Instructions (Signed)
Medication Instructions:  START Valsartan '80mg'$  daily START Metoprolol Succinate '25mg'$  daily *If you need a refill on your cardiac medications before your next appointment, please call your pharmacy*   Lab Work: BMET in 3 weeks If you have labs (blood work) drawn today and your tests are completely normal, you will receive your results only by: [place] (if you have MyChart) OR A paper copy in the mail If you have any lab test that is abnormal or we need to change your treatment, we will call you to review the results.   Testing/Procedures: NONE   Follow-Up: At [facility], you and your health needs are our priority.  As part of our continuing mission to provide you with exceptional heart care, we have created designated Provider Care Teams.  These Care Teams include your primary Cardiologist (physician) and Advanced Practice Providers (APPs -  Physician Assistants and Nurse Practitioners) who all work together to provide you with the care you need, when you need it.  We recommend signing up for the patient portal called "MyChart".  Sign up information is provided on this After Visit Summary.  MyChart is used to connect with patients for Virtual Visits (Telemedicine).  Patients are able to view lab/test results, encounter notes, upcoming appointments, etc.  Non-urgent messages can be sent to your provider as well.   To learn more about what you can do with MyChart, go to [email or web address].    Your next appointment:   03/25/21 @ 1:40   The format for your next appointment:   In Person  Provider:  Mertie Moores, MD    Important Information About Sugar

## 2022-02-12 NOTE — Progress Notes (Signed)
Cardiology Office Note:    Date:  02/12/2022   ID:  Vanessa Cole, DOB Jun 09, 1960, MRN [id number]  PCP:  [name], MD   [place] Providers Cardiologist:  [name]  Click to update primary MD,subspecialty MD or APP then REFRESH:1}    Referring MD: [name], MD   Chief Complaint  Patient presents with   finger numbness    History of Present Illness:    Vanessa Cole is a 61 y.o. female with a hx of DM ( 10 years ) , heart murmur,  HTN  Developed left finger numbness  Called her primary  Sent her to the ER ,  ECG looked ok  Normal sinus rhythm at 96.  No ST or T wave changes.  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   Past Medical History:  Diagnosis Date   Diabetes mellitus without complication (HCC)    Fibromyalgia    Gallstones    Heart murmur    Hyperlipidemia    Rosacea     Past Surgical History:  Procedure Laterality Date   APPENDECTOMY     CHOLECYSTECTOMY N/A 04/14/2014   Procedure: LAPAROSCOPIC CHOLECYSTECTOMY WITH INTRAOPERATIVE CHOLANGIOGRAM;  Surgeon: [name], MD;  Location: [facility];  Service: General;  Laterality: N/A;   FOOT SURGERY     HERNIA REPAIR     KNEE ARTHROSCOPY     NISSEN FUNDOPLICATION     ulcer surgery     same as NISSEN fundiplication    Current Medications: Current Meds  Medication Sig   acetaminophen (TYLENOL) 500 MG tablet Take 2 tablets (1,000 mg total) by mouth every 6 (six) hours as needed.   aspirin 81 MG tablet Take 81 mg by mouth daily at 12 noon.    Blood Glucose Monitoring Suppl (ONETOUCH VERIO FLEX SYSTEM) DEVI USE TWICE DAILY AS INSTRUCTED. (DX: ICD10: E11.65) In Vitro   doxycycline (VIBRA-TABS) 100 MG tablet Take 100 mg by mouth 2 (two)  times daily. For rosacia-- '@12'$  noon & @ 1700   metFORMIN (GLUCOPHAGE) 1000 MG tablet    metoprolol succinate (TOPROL XL) 25 MG 24 hr tablet Take 1 tablet (25 mg total) by mouth daily.   OneTouch Delica Lancets 40J MISC use BID daily or as instructed. dx: 250.02   ONETOUCH VERIO test strip 2 (two) times daily. as directed   pravastatin (PRAVACHOL) 20 MG tablet Take 20 mg by mouth every evening.   Semaglutide, 1 MG/DOSE, (OZEMPIC, 1 MG/DOSE,) 4 MG/3ML SOPN Inject 1 mg into the skin once a week.   TRESIBA FLEXTOUCH 100 UNIT/ML FlexTouch Pen INJECT 29 UNITS SUBCUTANEOUS ONCE A DAY   valsartan (DIOVAN) 80 MG tablet Take 1 tablet (80 mg total) by mouth daily.   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 There is no history of Breast cancer.  ROS:   Please see the history of present illness.     All other systems reviewed and are negative.  EKGs/Labs/Other Studies  Reviewed:    The following studies were reviewed today:   EKG:  Nov. 17, 2023:   NSR at 96.    Recent Labs: No results found for requested labs within last 365 days.  Recent Lipid Panel No results found for: "CHOL", "TRIG", "HDL", "CHOLHDL", "VLDL", "LDLCALC", "LDLDIRECT"   Risk Assessment/Calculations:      HYPERTENSION CONTROL Vitals:   02/12/22 1507 02/12/22 1527  BP: (!) 148/76 (!) 148/90    The patient's blood pressure is elevated above target today.  In order to address the patient's elevated BP: A new medication was prescribed today.            Physical Exam:    VS:  BP (!) 148/90   Pulse 93   Ht '5\' 4"'$  (1.626 m)   Wt 144 lb 6.4 oz (65.5 kg)   SpO2 98%   BMI 24.79 kg/m     Wt Readings from Last 3 Encounters:  02/12/22 144 lb 6.4 oz (65.5 kg)  01/25/22 147 lb (66.7 kg)  05/18/18 157 lb (71.2 kg)     GEN:  Well nourished, well developed in no acute distress HEENT: Normal NECK: No JVD; No carotid bruits LYMPHATICS: No lymphadenopathy CARDIAC: RRR, no murmurs, rubs, gallops RESPIRATORY:  Clear to auscultation without rales, wheezing or rhonchi  ABDOMEN: Soft, non-tender, non-distended MUSCULOSKELETAL:  No edema; No deformity  SKIN: Warm and dry NEUROLOGIC:  Alert and oriented x 3 PSYCHIATRIC:  Normal affect   ASSESSMENT:    1. Primary hypertension   2. Nonrheumatic mitral valve regurgitation   3. Numbness and tingling in left hand    PLAN:      Left hand numbness:   not c/w acute coronary syndrome.  EKG showed no changes.  She admits to leaning on her elbows whenever she is sitting.  She notices that this tends to worsen left hand tingling. I suggest that she see a neurologist or get some nerve conduction studies.  She likely has impingement of her radial nerve or perhaps her ulnar nerve.  She may have impingement at the level of her shoulder or could be cervical spine.   2.  Hypertension: Blood pressure remains elevated.  She states that it  has been up for a while.  She has diabetes.  Will start her on valsartan 80 mg a day.  Check a basic metabolic profile in 3 weeks.  Her heart rate is also a little fast.  Will start her on Toprol-XL 25 mg a day. She eats a fairly salty diet.  Advised salt restricton.  DASH diet     I will see her back in the office in 4 to 6 weeks.             Medication Adjustments/Labs and Tests Ordered: Current medicines are reviewed at length with the patient today.  Concerns regarding medicines are outlined above.  Orders Placed This Encounter  Procedures   Basic metabolic panel   Meds ordered this encounter  Medications   valsartan (DIOVAN) 80 MG tablet    Sig: Take 1 tablet (80 mg total) by mouth daily.    Dispense:  90 tablet    Refill:  3   metoprolol succinate (TOPROL XL) 25 MG 24 hr tablet    Sig: Take 1 tablet (25 mg total) by mouth daily.    Dispense:  90 tablet    Refill:  3    Patient Instructions  Medication Instructions:  START Valsartan '80mg'$  daily START Metoprolol Succinate '25mg'$  daily *If you need a refill on your cardiac medications before your next appointment, please call your pharmacy*   Lab Work: BMET in 3 weeks If you have labs (blood work) drawn today and your tests are completely normal, you will receive your results only by: [place] (if you have MyChart) OR A paper copy in the mail If you have any lab test that is abnormal or we need to change your treatment, we will call you to review the results.   Testing/Procedures: NONE   Follow-Up: At [facility], you and your health needs are our priority.  As part of our continuing mission to provide you with exceptional heart care, we have created designated Provider Care Teams.  These Care Teams include your primary Cardiologist (physician) and Advanced Practice Providers (APPs -  Physician Assistants and Nurse Practitioners) who all work together to provide you with the care you need, when you  need it.  We recommend signing up for the patient portal called "MyChart".  Sign up information is provided on this After Visit Summary.  MyChart is used to connect with patients for Virtual Visits (Telemedicine).  Patients are able to view lab/test results, encounter notes, upcoming appointments, etc.  Non-urgent messages can be sent to your provider as well.   To learn more about what you can do with MyChart, go to [email or web address].    Your next appointment:   03/25/21 @ 1:40   The format for your next appointment:   In Person  Provider:  Mertie Moores, MD    Important Information About Sugar         Signed, Mertie Moores, MD  02/12/2022 5:50 PM    [place]

## 2022-03-01 ENCOUNTER — Other Ambulatory Visit: Payer: Self-pay

## 2022-03-05 ENCOUNTER — Ambulatory Visit: Payer: BLUE CROSS/BLUE SHIELD | Attending: Cardiovascular Disease

## 2022-03-05 DIAGNOSIS — R200 Anesthesia of skin: Secondary | ICD-10-CM

## 2022-03-05 DIAGNOSIS — I10 Essential (primary) hypertension: Secondary | ICD-10-CM

## 2022-03-05 DIAGNOSIS — I340 Nonrheumatic mitral (valve) insufficiency: Secondary | ICD-10-CM

## 2022-03-06 LAB — BASIC METABOLIC PANEL
BUN/Creatinine Ratio: 23 (ref 12–28)
BUN: 12 mg/dL (ref 8–27)
CO2: 24 mmol/L (ref 20–29)
Calcium: 10 mg/dL (ref 8.7–10.3)
Chloride: 103 mmol/L (ref 96–106)
Creatinine, Ser: 0.53 mg/dL — ABNORMAL LOW (ref 0.57–1.00)
Glucose: 142 mg/dL — ABNORMAL HIGH (ref 70–99)
Potassium: 4.3 mmol/L (ref 3.5–5.2)
Sodium: 141 mmol/L (ref 134–144)
eGFR: 104 mL/min/{1.73_m2} (ref >59)

## 2022-03-13 ENCOUNTER — Other Ambulatory Visit (HOSPITAL_COMMUNITY): Payer: Self-pay

## 2022-03-13 MED ORDER — CELECOXIB 200 MG PO CAPS
200.0000 mg | ORAL_CAPSULE | Freq: Two times a day (BID) | ORAL | 0 refills | Status: DC
  Filled 2022-03-13: qty 60, 30d supply, fill #0

## 2022-03-18 ENCOUNTER — Other Ambulatory Visit (HOSPITAL_COMMUNITY): Payer: Self-pay

## 2022-03-18 ENCOUNTER — Other Ambulatory Visit: Payer: Self-pay

## 2022-03-25 ENCOUNTER — Ambulatory Visit: Payer: BLUE CROSS/BLUE SHIELD | Admitting: Cardiovascular Disease

## 2022-03-28 ENCOUNTER — Other Ambulatory Visit: Payer: Self-pay

## 2022-03-29 ENCOUNTER — Other Ambulatory Visit: Payer: Self-pay

## 2022-04-09 ENCOUNTER — Ambulatory Visit: Payer: BLUE CROSS/BLUE SHIELD | Attending: Cardiovascular Disease | Admitting: Physician Assistant

## 2022-04-09 ENCOUNTER — Encounter: Payer: Self-pay | Admitting: Physician Assistant

## 2022-04-09 VITALS — BP 120/80 | HR 85 | Ht 64.0 in | Wt 143.8 lb

## 2022-04-09 DIAGNOSIS — I10 Essential (primary) hypertension: Secondary | ICD-10-CM

## 2022-04-09 DIAGNOSIS — R011 Cardiac murmur, unspecified: Secondary | ICD-10-CM

## 2022-04-09 NOTE — Progress Notes (Signed)
  Cardiology Office Note:    Date:  04/09/2022   ID:  Vanessa Cole, DOB April 15, 1960, MRN [id number]  PCP:  [name], [place] [facility] Cardiologist:  Mertie Moores, MD    Referring MD: [name], MD   Patient Profile: Diabetes mellitus  Hypertension  Heart murmur      History of Present Illness:   Vanessa Cole is a 62 y.o. female with the above problem list.  She was evaluated by Dr. Acie Fredrickson 02/12/22 for L hand numbness that did not seem to be cardiac. Her symptoms sounded c/w radial or ulnar nerve impingement. Valsartan, Toprol XL were added for uncontrolled BP. She returns for f/u on hypertension. She is here alone. She is doing well w/o chest pain, shortness of breath, syncope.       Reviewed and updated this encounter:   Tobacco  Allergies  Meds  Problems  Med Hx  Surg Hx  Fam Hx     ROS  Labs/Other Test Reviewed:   Recent Labs: 03/05/2022: BUN 12; Creatinine, Ser 0.53; Potassium 4.3; Sodium 141   Recent Lipid Panel No results for input(s): "CHOL", "TRIG", "HDL", "VLDL", "LDLCALC", "LDLDIRECT" in the last 8760 hours.   Risk Assessment/Calculations/Metrics:             Physical Exam:   VS:  BP 120/80   Pulse 85   Ht '5\' 4"'$  (1.626 m)   Wt 143 lb 12.8 oz (65.2 kg)   SpO2 98%   BMI 24.68 kg/m    Wt Readings from Last 3 Encounters:  04/09/22 143 lb 12.8 oz (65.2 kg)  02/12/22 144 lb 6.4 oz (65.5 kg)  01/25/22 147 lb (66.7 kg)    Constitutional:      Appearance: Healthy appearance. Not in distress.  Neck:     Vascular: JVD normal.  Pulmonary:     Breath sounds: No wheezing. No rales.  Cardiovascular:     Normal rate. Regular rhythm.     Murmurs: There is a grade 2/6 harsh midsystolic murmur at the LLSB.  Edema:    Peripheral edema absent.  Abdominal:     Palpations: Abdomen is soft.          ASSESSMENT & PLAN:   Essential hypertension The patient's blood pressure is controlled on her current regimen which includes  Valsartan, Toprol XL. F/u 1 year.   Systolic ejection murmur Question MVP with mitral regurgitation. Arrange 2D echocardiogram.              Dispo:  Return in about 1 year (around 04/10/2023) for Routine Follow Up, w/ Dr. Acie Fredrickson, or Richardson Dopp, PA-C.   Signed, Richardson Dopp, PA-C  04/09/2022 3:34 PM    [address], [place], [place]  [id number] Phone: [phone]; Fax: [phone]

## 2022-04-09 NOTE — Patient Instructions (Addendum)
Medication Instructions:  Your physician recommends that you continue on your current medications as directed. Please refer to the Current Medication list given to you today.  *If you need a refill on your cardiac medications before your next appointment, please call your pharmacy*   Lab Work: None ordered If you have labs (blood work) drawn today and your tests are completely normal, you will receive your results only by: [place] (if you have MyChart) OR A paper copy in the mail If you have any lab test that is abnormal or we need to change your treatment, we will call you to review the results.   Testing/Procedures: Your physician has requested that you have an echocardiogram. Echocardiography is a painless test that uses sound waves to create images of your heart. It provides your doctor with information about the size and shape of your heart and how well your heart's chambers and valves are working. This procedure takes approximately one hour. There are no restrictions for this procedure. Please do NOT wear cologne, perfume, aftershave, or lotions (deodorant is allowed). Please arrive 15 minutes prior to your appointment time.    Follow-Up: At [facility], you and your health needs are our priority.  As part of our continuing mission to provide you with exceptional heart care, we have created designated Provider Care Teams.  These Care Teams include your primary Cardiologist (physician) and Advanced Practice Providers (APPs -  Physician Assistants and Nurse Practitioners) who all work together to provide you with the care you need, when you need it.  We recommend signing up for the patient portal called "MyChart".  Sign up information is provided on this After Visit Summary.  MyChart is used to connect with patients for Virtual Visits (Telemedicine).  Patients are able to view lab/test results, encounter notes, upcoming appointments, etc.  Non-urgent messages can be sent  to your provider as well.   To learn more about what you can do with MyChart, go to [email or web address].    Your next appointment:   1 year(s)  Provider:   Dr Acie Fredrickson  or Richardson Dopp, PA-C

## 2022-04-09 NOTE — Assessment & Plan Note (Signed)
Question MVP with mitral regurgitation. Arrange 2D echocardiogram.

## 2022-04-09 NOTE — Assessment & Plan Note (Addendum)
The patient's blood pressure is controlled on her current regimen which includes Valsartan, Toprol XL. F/u 1 year.

## 2022-04-15 ENCOUNTER — Telehealth (HOSPITAL_COMMUNITY): Payer: Self-pay | Admitting: Physician Assistant

## 2022-04-15 NOTE — Telephone Encounter (Signed)
Patient cancelled echocardiogram for reason below:  04/15/2022 2:41 PM [id number], APRIL  Cancel Rsn: Conflict/Need to Reschedule (Will be out of town. Will call back to reschedule.)  Order will be removed from the ECHO WQ and if pt calls back to reschedule we will reinstate the order. Thank you

## 2022-04-25 ENCOUNTER — Other Ambulatory Visit: Payer: Self-pay

## 2022-04-26 ENCOUNTER — Other Ambulatory Visit (HOSPITAL_COMMUNITY): Payer: BLUE CROSS/BLUE SHIELD

## 2022-04-26 ENCOUNTER — Other Ambulatory Visit: Payer: Self-pay

## 2022-04-29 ENCOUNTER — Other Ambulatory Visit: Payer: Self-pay

## 2022-05-06 ENCOUNTER — Telehealth: Payer: Self-pay | Admitting: *Deleted

## 2022-05-06 DIAGNOSIS — R011 Cardiac murmur, unspecified: Secondary | ICD-10-CM

## 2022-05-06 NOTE — Telephone Encounter (Signed)
   [place] Pre-operative Risk Assessment    Patient Name: Vanessa Cole  DOB: 15-Aug-1960 MRN: [id number]  HEARTCARE STAFF:  - IMPORTANT!!!!!! Under Visit Info/Reason for Call, type in Other and utilize the format Clearance MM/DD/YY or Clearance TBD. Do not use dashes or single digits. - Please review there is not already an duplicate clearance open for this procedure. - If request is for dental extraction, please clarify the # of teeth to be extracted. - If the patient is currently at the dentist's office, call Pre-Op Callback Staff (MA/nurse) to input urgent request.  - If the patient is not currently in the dentist office, please route to the Pre-Op pool.  Request for surgical clearance:  What type of surgery is being performed? Left carpal tunnel release, left cubital tunnel release  When is this surgery scheduled? 05/28/22  What type of clearance is required (medical clearance vs. Pharmacy clearance to hold med vs. Both)? Pharmacy  Are there any medications that need to be held prior to surgery and how long? None listed on form, but pt on [address]  Practice name and name of physician performing surgery? [facility]; Dr Wadie Lessen  What is the office phone number? [phone]   7.   What is the office fax number? [phone]  8.   Anesthesia type (None, local, MAC, general) ? Regional block   [place], [place] L 05/06/2022, 10:53 AM  _________________________________________________________________   (provider comments below)

## 2022-05-06 NOTE — Telephone Encounter (Signed)
Covering preop today. Patient was recently seen  by Richardson Dopp PA-C less than a month ago on 04/09/22 - seen for follow-up of hand numbenss not felt to be cardiac, recent HTN med follow-up. 2D echo pending for murmur. Will route to [place] to see if he feels comfortable providing a preop recommendation regarding this, vs awaiting echo.

## 2022-05-07 NOTE — Telephone Encounter (Signed)
Pt had murmur on exam and echocardiogram was ordered but test was canceled by the patient. Ideally would need to have echocardiogram done prior to surgical clearance. However, the patient did not have chest pain or shortness of breath when last seen. Carpal tunnel release is low risk given regional block anesthesia.  Pt can proceed at acceptable risk without further testing. Please make sure echocardiogram rescheduled.  Richardson Dopp, PA-C    05/07/2022 1:55 PM

## 2022-05-07 NOTE — Telephone Encounter (Addendum)
   Patient Name: Vanessa Cole  DOB: [birth year] MRN: [id number]  Primary Cardiologist: Mertie Moores, MD  Chart reviewed as part of pre-operative protocol coverage. As below, patient recently evaluated by Richardson Dopp. Per his reply, "Pt had murmur on exam and echocardiogram was ordered but test was canceled by the patient. Ideally would need to have echocardiogram done prior to surgical clearance. However, the patient did not have chest pain or shortness of breath when last seen. Carpal tunnel release is low risk given regional block anesthesia. Pt can proceed at acceptable risk without further testing." Our office will work on rescheduling her echo but per [place] to go ahead and clear without this. We typically advise that aspirin be continued in the perioperative period if acceptable but the patient has no absolute contraindications to holding for up to 7 days if surgeon feels necessary.  Will route this bundled recommendation to requesting provider via Epic fax function. Please call with questions.  Charlie Pitter, PA-C 05/07/2022, 1:58 PM

## 2022-05-07 NOTE — Telephone Encounter (Signed)
Call placed to pt.  Left a detailed message asking pt to call the office back to reschedule her Echocardiogram.

## 2022-05-20 ENCOUNTER — Other Ambulatory Visit: Payer: Self-pay

## 2022-06-21 ENCOUNTER — Other Ambulatory Visit: Payer: Self-pay

## 2022-07-10 ENCOUNTER — Encounter: Payer: Self-pay | Admitting: *Deleted

## 2022-07-10 ENCOUNTER — Telehealth (HOSPITAL_COMMUNITY): Payer: Self-pay | Admitting: Physician Assistant

## 2022-07-10 NOTE — Telephone Encounter (Signed)
I called patient to schedule echocardiogram and patient states that we are not in Network and unable to afford to have. She will follow up with a cardiologist in Network. Order will be removed from the echo WQ.

## 2022-07-10 NOTE — Telephone Encounter (Signed)
Left another message for pt regarding the need to reschedule her Echocardiogram.   Will send a letter.

## 2022-07-10 NOTE — Addendum Note (Signed)
Addended by: Burnetta Sabin on: 07/10/2022 01:58 PM   Modules accepted: Orders

## 2023-02-03 ENCOUNTER — Other Ambulatory Visit: Payer: Self-pay | Admitting: Cardiovascular Disease

## 2023-02-03 DIAGNOSIS — R200 Anesthesia of skin: Secondary | ICD-10-CM

## 2023-02-03 DIAGNOSIS — I10 Essential (primary) hypertension: Secondary | ICD-10-CM

## 2023-02-03 DIAGNOSIS — I340 Nonrheumatic mitral (valve) insufficiency: Secondary | ICD-10-CM

## 2023-02-04 ENCOUNTER — Other Ambulatory Visit: Payer: Self-pay | Admitting: *Deleted

## 2023-02-04 DIAGNOSIS — I340 Nonrheumatic mitral (valve) insufficiency: Secondary | ICD-10-CM

## 2023-02-04 DIAGNOSIS — I10 Essential (primary) hypertension: Secondary | ICD-10-CM

## 2023-02-04 DIAGNOSIS — R200 Anesthesia of skin: Secondary | ICD-10-CM

## 2023-02-04 MED ORDER — METOPROLOL SUCCINATE ER 25 MG PO TB24: 25.0000 mg | ORAL_TABLET | Freq: Every day | ORAL | 0 refills | Status: DC

## 2023-05-03 ENCOUNTER — Other Ambulatory Visit: Payer: Self-pay | Admitting: Cardiovascular Disease

## 2023-05-03 DIAGNOSIS — R200 Anesthesia of skin: Secondary | ICD-10-CM

## 2023-05-03 DIAGNOSIS — I340 Nonrheumatic mitral (valve) insufficiency: Secondary | ICD-10-CM

## 2023-05-03 DIAGNOSIS — I10 Essential (primary) hypertension: Secondary | ICD-10-CM

## 2023-05-26 ENCOUNTER — Other Ambulatory Visit: Payer: Self-pay | Admitting: Cardiovascular Disease

## 2023-05-26 DIAGNOSIS — I340 Nonrheumatic mitral (valve) insufficiency: Secondary | ICD-10-CM

## 2023-05-26 DIAGNOSIS — R200 Anesthesia of skin: Secondary | ICD-10-CM

## 2023-05-26 DIAGNOSIS — I10 Essential (primary) hypertension: Secondary | ICD-10-CM

## 2023-07-03 ENCOUNTER — Other Ambulatory Visit: Payer: Self-pay | Admitting: Cardiovascular Disease

## 2023-07-03 DIAGNOSIS — I10 Essential (primary) hypertension: Secondary | ICD-10-CM

## 2023-07-03 DIAGNOSIS — R200 Anesthesia of skin: Secondary | ICD-10-CM

## 2023-07-03 DIAGNOSIS — I340 Nonrheumatic mitral (valve) insufficiency: Secondary | ICD-10-CM
